# Patient Record
Sex: Female | Born: 1950 | Race: White | Hispanic: No | Marital: Married | Smoking: Current every day smoker
Health system: Southern US, Community
[De-identification: ages and names within clinical notes are randomized; demographics above are authoritative.]

## PROBLEM LIST (undated history)

## (undated) DIAGNOSIS — G43909 Migraine, unspecified, not intractable, without status migrainosus: Secondary | ICD-10-CM

## (undated) DIAGNOSIS — E785 Hyperlipidemia, unspecified: Secondary | ICD-10-CM

## (undated) DIAGNOSIS — I10 Essential (primary) hypertension: Secondary | ICD-10-CM

## (undated) DIAGNOSIS — G40909 Epilepsy, unspecified, not intractable, without status epilepticus: Secondary | ICD-10-CM

## (undated) DIAGNOSIS — I6529 Occlusion and stenosis of unspecified carotid artery: Secondary | ICD-10-CM

## (undated) HISTORY — DX: Essential (primary) hypertension: I10

## (undated) HISTORY — DX: Occlusion and stenosis of unspecified carotid artery: I65.29

## (undated) HISTORY — PX: MOUTH SURGERY: SHX715

## (undated) HISTORY — PX: TUBAL LIGATION: SHX77

## (undated) HISTORY — DX: Hyperlipidemia, unspecified: E78.5

## (undated) HISTORY — DX: Migraine, unspecified, not intractable, without status migrainosus: G43.909

## (undated) HISTORY — PX: COLONOSCOPY: SHX174

---

## 2015-05-20 ENCOUNTER — Emergency Department (HOSPITAL_COMMUNITY)
Admission: EM | Admit: 2015-05-20 | Discharge: 2015-05-20 | Disposition: A | Discharge status: Home / Self Care | Payer: Commercial Managed Care - PPO | Attending: Emergency Medicine | Admitting: Emergency Medicine

## 2015-05-20 ENCOUNTER — Emergency Department (HOSPITAL_COMMUNITY): Payer: Commercial Managed Care - PPO

## 2015-05-20 ENCOUNTER — Encounter (HOSPITAL_COMMUNITY): Payer: Self-pay | Admitting: Emergency Medicine

## 2015-05-20 DIAGNOSIS — R197 Diarrhea, unspecified: Secondary | ICD-10-CM | POA: Diagnosis not present

## 2015-05-20 DIAGNOSIS — R030 Elevated blood-pressure reading, without diagnosis of hypertension: Secondary | ICD-10-CM | POA: Insufficient documentation

## 2015-05-20 DIAGNOSIS — Z8669 Personal history of other diseases of the nervous system and sense organs: Secondary | ICD-10-CM | POA: Insufficient documentation

## 2015-05-20 DIAGNOSIS — Z720 Tobacco use: Secondary | ICD-10-CM | POA: Insufficient documentation

## 2015-05-20 DIAGNOSIS — R55 Syncope and collapse: Secondary | ICD-10-CM | POA: Diagnosis present

## 2015-05-20 DIAGNOSIS — R109 Unspecified abdominal pain: Secondary | ICD-10-CM | POA: Diagnosis not present

## 2015-05-20 DIAGNOSIS — Z79899 Other long term (current) drug therapy: Secondary | ICD-10-CM | POA: Insufficient documentation

## 2015-05-20 DIAGNOSIS — IMO0001 Reserved for inherently not codable concepts without codable children: Secondary | ICD-10-CM

## 2015-05-20 HISTORY — DX: Epilepsy, unspecified, not intractable, without status epilepticus: G40.909

## 2015-05-20 LAB — CBC
HEMATOCRIT: 43.9 % (ref 36.0–46.0)
HEMOGLOBIN: 14.3 g/dL (ref 12.0–15.0)
MCH: 28.5 pg (ref 26.0–34.0)
MCHC: 32.6 g/dL (ref 30.0–36.0)
MCV: 87.5 fL (ref 78.0–100.0)
Platelets: 211 10*3/uL (ref 150–400)
RBC: 5.02 MIL/uL (ref 3.87–5.11)
RDW: 13.7 % (ref 11.5–15.5)
WBC: 9.6 10*3/uL (ref 4.0–10.5)

## 2015-05-20 LAB — URINE MICROSCOPIC-ADD ON

## 2015-05-20 LAB — CBG MONITORING, ED: GLUCOSE-CAPILLARY: 140 mg/dL — AB (ref 65–99)

## 2015-05-20 LAB — BASIC METABOLIC PANEL
Anion gap: 7 (ref 5–15)
BUN: 6 mg/dL (ref 6–20)
CO2: 27 mmol/L (ref 22–32)
Calcium: 8.5 mg/dL — ABNORMAL LOW (ref 8.9–10.3)
Chloride: 102 mmol/L (ref 101–111)
Creatinine, Ser: 1.13 mg/dL — ABNORMAL HIGH (ref 0.44–1.00)
GFR calc non Af Amer: 51 mL/min — ABNORMAL LOW (ref >60)
GFR, EST AFRICAN AMERICAN: 59 mL/min — AB (ref >60)
Glucose, Bld: 146 mg/dL — ABNORMAL HIGH (ref 65–99)
Potassium: 3.8 mmol/L (ref 3.5–5.1)
SODIUM: 136 mmol/L (ref 135–145)

## 2015-05-20 LAB — HEPATIC FUNCTION PANEL
ALK PHOS: 56 U/L (ref 38–126)
ALT: 15 U/L (ref 14–54)
AST: 16 U/L (ref 15–41)
Albumin: 3.4 g/dL — ABNORMAL LOW (ref 3.5–5.0)
Total Bilirubin: 0.6 mg/dL (ref 0.3–1.2)
Total Protein: 6.7 g/dL (ref 6.5–8.1)

## 2015-05-20 LAB — URINALYSIS, ROUTINE W REFLEX MICROSCOPIC
Bilirubin Urine: NEGATIVE
GLUCOSE, UA: NEGATIVE mg/dL
Ketones, ur: NEGATIVE mg/dL
Leukocytes, UA: NEGATIVE
NITRITE: NEGATIVE
Protein, ur: NEGATIVE mg/dL
SPECIFIC GRAVITY, URINE: 1.006 (ref 1.005–1.030)
UROBILINOGEN UA: 1 mg/dL (ref 0.0–1.0)
pH: 6 (ref 5.0–8.0)

## 2015-05-20 LAB — TROPONIN I: Troponin I: 0.03 ng/mL (ref <0.031)

## 2015-05-20 MED ORDER — HYDROCHLOROTHIAZIDE 25 MG PO TABS: 25.0000 mg | ORAL_TABLET | Freq: Every day | ORAL | Status: DC

## 2015-05-20 MED ORDER — SODIUM CHLORIDE 0.9 % IV BOLUS (SEPSIS)
1000.0000 mL | Freq: Once | INTRAVENOUS | Status: AC
  Administered 2015-05-20: 1000 mL via INTRAVENOUS

## 2015-05-20 NOTE — Discharge Instructions (Signed)
Syncope °Syncope is a medical term for fainting or passing out. This means you lose consciousness and drop to the ground. People are generally unconscious for less than 5 minutes. You may have some muscle twitches for up to 15 seconds before waking up and returning to normal. Syncope occurs more often in older adults, but it can happen to anyone. While most causes of syncope are not dangerous, syncope can be a sign of a serious medical problem. It is important to seek medical care.  °CAUSES  °Syncope is caused by a sudden drop in blood flow to the brain. The specific cause is often not determined. Factors that can bring on syncope include: °· Taking medicines that lower blood pressure. °· Sudden changes in posture, such as standing up quickly. °· Taking more medicine than prescribed. °· Standing in one place for too long. °· Seizure disorders. °· Dehydration and excessive exposure to heat. °· Low blood sugar (hypoglycemia). °· Straining to have a bowel movement. °· Heart disease, irregular heartbeat, or other circulatory problems. °· Fear, emotional distress, seeing blood, or severe pain. °SYMPTOMS  °Right before fainting, you may: °· Feel dizzy or light-headed. °· Feel nauseous. °· See all white or all black in your field of vision. °· Have cold, clammy skin. °DIAGNOSIS  °Your health care provider will ask about your symptoms, perform a physical exam, and perform an electrocardiogram (ECG) to record the electrical activity of your heart. Your health care provider may also perform other heart or blood tests to determine the cause of your syncope which may include: °· Transthoracic echocardiogram (TTE). During echocardiography, sound waves are used to evaluate how blood flows through your heart. °· Transesophageal echocardiogram (TEE). °· Cardiac monitoring. This allows your health care provider to monitor your heart rate and rhythm in real time. °· Holter monitor. This is a portable device that records your  heartbeat and can help diagnose heart arrhythmias. It allows your health care provider to track your heart activity for several days, if needed. °· Stress tests by exercise or by giving medicine that makes the heart beat faster. °TREATMENT  °In most cases, no treatment is needed. Depending on the cause of your syncope, your health care provider may recommend changing or stopping some of your medicines. °HOME CARE INSTRUCTIONS °· Have someone stay with you until you feel stable. °· Do not drive, use machinery, or play sports until your health care provider says it is okay. °· Keep all follow-up appointments as directed by your health care provider. °· Lie down right away if you start feeling like you might faint. Breathe deeply and steadily. Wait until all the symptoms have passed. °· Drink enough fluids to keep your urine clear or pale yellow. °· If you are taking blood pressure or heart medicine, get up slowly and take several minutes to sit and then stand. This can reduce dizziness. °SEEK IMMEDIATE MEDICAL CARE IF:  °· You have a severe headache. °· You have unusual pain in the chest, abdomen, or back. °· You are bleeding from your mouth or rectum, or you have black or tarry stool. °· You have an irregular or very fast heartbeat. °· You have pain with breathing. °· You have repeated fainting or seizure-like jerking during an episode. °· You faint when sitting or lying down. °· You have confusion. °· You have trouble walking. °· You have severe weakness. °· You have vision problems. °If you fainted, call your local emergency services (911 in [place] Do not drive   yourself to the hospital.  MAKE SURE YOU:  Understand these instructions.  Will watch your condition.  Will get help right away if you are not doing well or get worse. Document Released: 10/14/2005 Document Revised: 10/19/2013 Document Reviewed: 12/13/2011 [facility] Patient Information 2015 [place], [place]. This information is not intended to replace  advice given to you by your health care provider. Make sure you discuss any questions you have with your health care provider.  Hypertension Hypertension, commonly called high blood pressure, is when the force of blood pumping through your arteries is too strong. Your arteries are the blood vessels that carry blood from your heart throughout your body. A blood pressure reading consists of a higher number over a lower number, such as 110/72. The higher number (systolic) is the pressure inside your arteries when your heart pumps. The lower number (diastolic) is the pressure inside your arteries when your heart relaxes. Ideally you want your blood pressure below 120/80. Hypertension forces your heart to work harder to pump blood. Your arteries may become narrow or stiff. Having hypertension puts you at risk for heart disease, stroke, and other problems.  RISK FACTORS Some risk factors for high blood pressure are controllable. Others are not.  Risk factors you cannot control include:   Race. You may be at higher risk if you are African American.  Age. Risk increases with age.  Gender. Men are at higher risk than women before age 57 years. After age 70, women are at higher risk than men. Risk factors you can control include:  Not getting enough exercise or physical activity.  Being overweight.  Getting too much fat, sugar, calories, or salt in your diet.  Drinking too much alcohol. SIGNS AND SYMPTOMS Hypertension does not usually cause signs or symptoms. Extremely high blood pressure (hypertensive crisis) may cause headache, anxiety, shortness of breath, and nosebleed. DIAGNOSIS  To check if you have hypertension, your health care provider will measure your blood pressure while you are seated, with your arm held at the level of your heart. It should be measured at least twice using the same arm. Certain conditions can cause a difference in blood pressure between your right and left arms. A blood  pressure reading that is higher than normal on one occasion does not mean that you need treatment. If one blood pressure reading is high, ask your health care provider about having it checked again. TREATMENT  Treating high blood pressure includes making lifestyle changes and possibly taking medicine. Living a healthy lifestyle can help lower high blood pressure. You may need to change some of your habits. Lifestyle changes may include:  Following the DASH diet. This diet is high in fruits, vegetables, and whole grains. It is low in salt, red meat, and added sugars.  Getting at least 2 hours of brisk physical activity every week.  Losing weight if necessary.  Not smoking.  Limiting alcoholic beverages.  Learning ways to reduce stress. If lifestyle changes are not enough to get your blood pressure under control, your health care provider may prescribe medicine. You may need to take more than one. Work closely with your health care provider to understand the risks and benefits. HOME CARE INSTRUCTIONS  Have your blood pressure rechecked as directed by your health care provider.   Take medicines only as directed by your health care provider. Follow the directions carefully. Blood pressure medicines must be taken as prescribed. The medicine does not work as well when you skip doses. Skipping doses  also puts you at risk for problems.   Do not smoke.   Monitor your blood pressure at home as directed by your health care provider. SEEK MEDICAL CARE IF:   You think you are having a reaction to medicines taken.  You have recurrent headaches or feel dizzy.  You have swelling in your ankles.  You have trouble with your vision. SEEK IMMEDIATE MEDICAL CARE IF:  You develop a severe headache or confusion.  You have unusual weakness, numbness, or feel faint.  You have severe chest or abdominal pain.  You vomit repeatedly.  You have trouble breathing. MAKE SURE YOU:   Understand  these instructions.  Will watch your condition.  Will get help right away if you are not doing well or get worse. Document Released: 10/14/2005 Document Revised: 02/28/2014 Document Reviewed: 08/06/2013 [facility] Patient Information 2015 [place], [place]. This information is not intended to replace advice given to you by your health care provider. Make sure you discuss any questions you have with your health care provider.   Emergency Department Resource Guide 1) Find a Doctor and Pay Out of Pocket Although you won't have to find out who is covered by your insurance plan, it is a good idea to ask around and get recommendations. You will then need to call the office and see if the doctor you have chosen will accept you as a new patient and what types of options they offer for patients who are self-pay. Some doctors offer discounts or will set up payment plans for their patients who do not have insurance, but you will need to ask so you aren't surprised when you get to your appointment.  2) Contact Your Local Health Department Not all health departments have doctors that can see patients for sick visits, but many do, so it is worth a call to see if yours does. If you don't know where your local health department is, you can check in your phone book. The CDC also has a tool to help you locate your state's health department, and many state websites also have listings of all of their local health departments.  3) Find a Walk-in Clinic If your illness is not likely to be very severe or complicated, you may want to try a walk in clinic. These are popping up all over the country in pharmacies, drugstores, and shopping centers. They're usually staffed by nurse practitioners or physician assistants that have been trained to treat common illnesses and complaints. They're usually fairly quick and inexpensive. However, if you have serious medical issues or chronic medical problems, these are probably not your best  option.  No Primary Care Doctor: - Call Health Connect at  [phone] - they can help you locate a primary care doctor that  accepts your insurance, provides certain services, etc. - Physician Referral Service- [phone]  Chronic Pain Problems: Organization         Address  Phone   Notes  [facility]  [phone] Patients need to be referred by their primary care doctor.   Medication Assistance: Organization         Address  Phone   Notes  [facility] Medication [facility] [address] [place] [place], [place] [address] [phone] --Must be a resident of [facility] -- Must have NO insurance coverage whatsoever (no Medicaid/ Medicare, etc.) -- The pt. MUST have a primary care doctor that directs their care regularly and follows them in the community  [facility]  [phone]   [employer]  [phone]    Agencies that provide inexpensive medical care: Organization         Address  Phone   Notes  [facility]  [phone]   [facility]    [phone]   [facility] [address] [place], [place] [address] [phone]   [facility] [address] [place] [place] [phone]   Planned Parenthood    [phone]   [facility]    [phone]   [facility]  [address], [place] Phone:  [phone], Fax:  [phone] Hours of Operation:  9 am - 6 pm, M-F.  Also accepts Medicaid/Medicare and self-pay.  [facility]  [address], [place] Phone: [phone], Fax: [phone]. Hours of Operation:  8:30 am - 5:30 pm, M-F.  Also accepts Medicaid and self-pay.  [facility] [place] [address], [place] Phone: [phone]   [facility] [address] Natasha Bence [place], [place] [phone], Ext. [phone] Mondays & Thursdays: 7-9 AM.  First 15  patients are seen on a first come, first serve basis.    Medicaid-accepting [facility] Providers:  Organization         Address  Phone   Notes  [facility] [address], [place] [phone] Also accepts self-pay patients.  [facility] [address] Laurell Josephs [place], [place]  [phone]   [facility] [address], [place] [phone]   [facility] [address] [place] [phone]   Renaye Rakers [address], [place]   [phone] Only accepts [place] Access [place] patients after they have their name applied to their card.   Self-Pay (no insurance) in [facility]:  Organization         Address  Phone   Notes  Sickle Cell Patients, [facility] [address] [place], [place] [phone]   [facility] [address] [place], [place] [phone]   [facility] [place]  [address], [place] [phone]   [facility]/Dr. Osei-Bonsu  [address] [place] or [address], [place] [phone] Phone number for both [place] and [place] locations is the same.  [facility] [address] [place] [address], [place] [phone]   [facility] [address] [place] or [address] [phone]   [facility] [address], [place] [phone], phone; [phone], fax Sees patients 1st and 3rd Saturday of every month.  Must not qualify for public or private insurance (i.e. Medicaid, Medicare, [place] Health Choice, Veterans' Benefits)  Household income should be no more than 200% of the poverty level The clinic cannot treat you if you are pregnant or think you are pregnant  Sexually transmitted diseases are not treated at the clinic.    Dental  Care: Organization         Address  Phone  Notes  [facility] Department of [facility] [address] [place], [place] [phone] Accepts children up to age 61 who are enrolled in [place] or [place] Health Choice;  pregnant women with a Medicaid card; and children who have applied for Medicaid or [place] Health Choice, but were declined, whose parents can pay a reduced fee at time of service.  [facility] Department of [facility]  [address], [place] [phone] Accepts children up to age 42 who are enrolled in [place] or [place] Health Choice; pregnant women with a Medicaid card; and children who have applied for Medicaid or [place] Health Choice, but were declined, whose parents can pay a reduced fee at time of service.  [facility]  [address] [place], [place] [phone] Patients are seen by appointment only. Walk-ins are not accepted. [facility] will see patients 60 years of age and older. Monday - Tuesday (8am-5pm) Most Wednesdays (8:30-5pm) $30 per visit, cash only  [facility] Adult Dental Access [facility]  [address], [facility] [phone] Patients are seen by appointment only. Walk-ins are not accepted. [facility] will see patients 49 years of age and older. One Wednesday Evening (Monthly: Volunteer Based).  $30 per visit, cash only  [employer]  [phone] for adults; Children under age 36, call Graduate Pediatric Dentistry at [phone]. Children aged 39-14, please call [phone] to request a pediatric application.  Dental services are provided in all areas of dental care including fillings, crowns and bridges, complete and partial dentures, implants, gum treatment, root canals, and extractions. Preventive care is also provided. Treatment is provided to both adults and children. Patients are selected via a lottery and there is often a waiting list.   [facility] [address] [place]  [phone] [email or web address]   [facility] [address] [place], [place] [phone], Ext. [phone] Second and Fourth Thursday of each month, opens at 6:30 AM; Clinic ends at 9 AM.  Patients are seen on a first-come first-served basis, and a limited number are seen during each clinic.   [facility]  [address] Ether Griffins [place], [place] [phone]   Eligibility Requirements You must have lived in [place], [place], or [place] counties for at least the last three months.   You cannot be eligible for state or federal sponsored [place], including [employer], [place], or [employer].   You generally cannot be eligible for healthcare insurance through your employer.    How to apply: Eligibility screenings are held every Tuesday and Wednesday afternoon from 1:00 pm until 4:00 pm. You do not need an appointment for the interview!  [facility] [address], [place], [place] [phone]   [facility] Health Department  [phone]   [facility] Health Department  [phone]   [facility] Health [facility]  [phone]    Behavioral Health Resources in the Community: Intensive Outpatient Programs Organization         Address  Phone  Notes  [facility] [address] [place], [place] [phone]   [facility] Outpatient [address] [place], [place] [phone]   [facility] [address] [place], [place]  [phone]   [facility] [address],  [place], [place] [phone] or [phone]   Substance Abuse Resources Organization         Address  Phone  Notes  [facility]  [phone]   [facility]  [phone]   [facility]  [phone]  Floydene Flock  [phone]   Residential & Outpatient Substance Abuse Program  [phone]    Psychological Services Organization         Address  Phone  Notes  [facility]  [phone]   [facility] Services  [phone]   [facility] [address] [place] [phone] or [phone]    Mobile Crisis Teams Organization         Address  Phone  Notes  [facility], Mobile Crisis Care Unit  [phone]   [facility]  [address] [place], [place] [phone]   Doristine Locks [address] [place] [phone]    Self-Help/Support Groups Organization         Address  Phone             Notes  [facility] [place] - variety of support groups  336- I7437963 Call for more information  Narcotics Anonymous (NA), [facility] [address], [employer] [place]  2 meetings at this location   Statistician         Address  Phone  Notes  [facility] [address],    [place]  [phone]   [facility]  [address] [place] [address], [place], [place] [phone]   [facility] [address] [place], [place] [phone] Admissions: 8am-3pm M-F  [facility] [address]    [place], [place] [phone]   [facility] [address] [place], [place], [place] [phone]   [facility] [address],  [place], [place] [phone]   [facility] - Intensive Outpatient [address] Laurell Josephs [address], [place], [place] [phone]   [facility] ([facility] [address] [place]  [place], [place] [phone] or [phone]   [facility] ([facility]) [address] [place], [place] [phone] Accepts Medicaid  [facility] [address],  [place] [phone] Substance Abuse/Addiction Treatment   [facility] Organization         Address  Phone  Notes  [facility]  [phone]   Angie Fava, PhD [address] Ervin Knack [place], [place]   [phone] or [phone]   [facility]   [address] [place], [place] [phone]   [facility] [address] [place], [place] [phone] Insurance/Medicaid/sponsorship through [facility] and Families [address]                                    [place], [place] [phone] Therapy/tele-psych/case  [facility] [address], [place] [phone]    Dr. Lolly Mustache  [phone]   [facility]  [employer] [facility] Dept. 1) [address], [place] 2) [address] [place] 3)  [address], [place] [phone]  [phone]   [facility] Child Abuse Hotline 3524976475 or [phone] (After Hours)

## 2015-05-20 NOTE — ED Notes (Signed)
Pt ambulated to restroom with steady gait.

## 2015-05-20 NOTE — ED Provider Notes (Signed)
CSN: [id number]     Arrival date & time 05/20/15  1107 History   First MD Initiated Contact with Patient 05/20/15 1126     Chief Complaint  Patient presents with  . Loss of Consciousness  . Abdominal Cramping     (Consider location/radiation/quality/duration/timing/severity/associated sxs/prior Treatment) HPI Patient is a 64 year old female with no past medical history who presents the ER complaining of syncopal episode. Patient states she had some mild abdominal cramping, but had an episode of diarrhea while at the grocery store. Patient states after having a bowel movement she stood up, became lightheaded, and felt hot. Patient states she began walking then subsequently experienced a syncopal episode. Patient reports waking up immediately after the episode, denies having any injury. Patient states her symptoms gradually have resolved since then, however is here for evaluation. Patient denies currently any headache, blurred vision, dizziness, weakness, chest pain, shortness of breath, nausea, vomiting, abdominal pain, persistent diarrhea, dysuria.  Past Medical History  Diagnosis Date  . Epilepsy    Past Surgical History  Procedure Laterality Date  . Mouth surgery    . Tubal ligation     History reviewed. No pertinent family history. History  Substance Use Topics  . Smoking status: Current Every Day Smoker -- 2.00 packs/day    Types: Cigarettes  . Smokeless tobacco: Not on file  . Alcohol Use: Yes     Comment: occasional   OB History    No data available     Review of Systems  Constitutional: Negative for fever.  HENT: Negative for trouble swallowing.   Eyes: Negative for visual disturbance.  Respiratory: Negative for shortness of breath.   Cardiovascular: Negative for chest pain.  Gastrointestinal: Positive for diarrhea. Negative for nausea, vomiting and abdominal pain.  Genitourinary: Negative for dysuria.  Musculoskeletal: Negative for neck pain.  Skin: Negative for  rash.  Neurological: Positive for syncope. Negative for dizziness, weakness and numbness.  Psychiatric/Behavioral: Negative.     Allergies  Dilantin  Home Medications   Prior to Admission medications   Medication Sig Start Date End Date Taking? Authorizing Provider  HYDROcodone-acetaminophen (NORCO/VICODIN) 5-325 MG per tablet Take 1 tablet by mouth daily as needed. 05/11/15  Yes Historical Provider, MD  ibuprofen (ADVIL,MOTRIN) 200 MG tablet Take 600 mg by mouth every 6 (six) hours as needed for moderate pain.   Yes Historical Provider, MD  amoxicillin (AMOXIL) 500 MG capsule Take 500 mg by mouth 3 (three) times daily. 05/11/15   Historical Provider, MD  hydrochlorothiazide (HYDRODIURIL) 25 MG tablet Take 1 tablet (25 mg total) by mouth daily. 05/20/15   Ladona Mow, PA-C   BP 219/88 mmHg  Pulse 79  Temp(Src) 97.7 F (36.5 C) (Oral)  Resp 20  Ht 5\' 4"  (1.626 m)  Wt 170 lb (77.111 kg)  BMI 29.17 kg/m2  SpO2 100% Physical Exam  Constitutional: She is oriented to person, place, and time. She appears well-developed and well-nourished. No distress.  HENT:  Head: Normocephalic and atraumatic.  Mouth/Throat: Oropharynx is clear and moist. No oropharyngeal exudate.  Eyes: Right eye exhibits no discharge. Left eye exhibits no discharge. No scleral icterus.  Neck: Normal range of motion.  Cardiovascular: Normal rate, regular rhythm and normal heart sounds.   No murmur heard. Pulmonary/Chest: Effort normal and breath sounds normal. No respiratory distress.  Abdominal: Soft. There is no tenderness.  Musculoskeletal: Normal range of motion. She exhibits no edema or tenderness.  Neurological: She is alert and oriented to person, place, and time. She  has normal strength. No cranial nerve deficit or sensory deficit. She displays a negative Romberg sign. Coordination and gait normal. GCS eye subscore is 4. GCS verbal subscore is 5. GCS motor subscore is 6.  Patient fully alert, answering questions  appropriately in full, clear sentences. Cranial nerves II through XII grossly intact. Motor strength 5 out of 5 in all major muscle groups of upper and lower extremities. Distal sensation intact.   Skin: Skin is warm and dry. No rash noted. She is not diaphoretic.  Psychiatric: She has a normal mood and affect.  Nursing note and vitals reviewed.   ED Course  Procedures (including critical care time) Labs Review Labs Reviewed  BASIC METABOLIC PANEL - Abnormal; Notable for the following:    Glucose, Bld 146 (*)    Creatinine, Ser 1.13 (*)    Calcium 8.5 (*)    GFR calc non Af Amer 51 (*)    GFR calc Af Amer 59 (*)    All other components within normal limits  URINALYSIS, ROUTINE W REFLEX MICROSCOPIC (NOT AT [facility]) - Abnormal; Notable for the following:    Hgb urine dipstick SMALL (*)    All other components within normal limits  HEPATIC FUNCTION PANEL - Abnormal; Notable for the following:    Albumin 3.4 (*)    Bilirubin, Direct <0.1 (*)    All other components within normal limits  CBG MONITORING, ED - Abnormal; Notable for the following:    Glucose-Capillary 140 (*)    All other components within normal limits  CBC  TROPONIN I  URINE MICROSCOPIC-ADD ON    Imaging Review Dg Chest 2 View  05/20/2015   CLINICAL DATA:  Recent syncopal episode, history of tobacco use  EXAM: CHEST - 2 VIEW  COMPARISON:  None.  FINDINGS: The heart size and mediastinal contours are within normal limits. Both lungs are clear. The visualized skeletal structures are unremarkable.  IMPRESSION: No active disease.   Electronically Signed   By: Alcide Clever M.D.   On: 05/20/2015 14:06     EKG Interpretation   Date/Time:  Saturday May 20 2015 11:09:26 EDT Ventricular Rate:  71 PR Interval:  162 QRS Duration: 92 QT Interval:  430 QTC Calculation: 467 R Axis:   61 Text Interpretation:  Sinus rhythm No previous tracing Confirmed by  Anitra Lauth  MD, WHITNEY ([id number]) on 05/20/2015 11:14:50 AM      MDM    Final diagnoses:  Syncope  Elevated blood pressure    Patient here for evaluation after syncopal episode. Patient asymptomatic in the ER. Patient reports abdominal cramping in her lower abdomen followed by diarrhea. She reports abdominal cramping has since resolved. Patient's abdominal exam is benign, no concern for acute abdomen. Lab work unremarkable for acute pathology. Wells Criteria 0 for PE. No concern for sepsis or SIRS. EKG without evidence of acute injury or ectopy. Troponin negative rate and 6 hours after onset of symptoms. Based on patient's history, no concern for cardiac etiology of patient's syncope. Likely vasovagal response secondary to her diarrhea. Workup here overall is benign. Neurologic exam is benign without deficit. No concern for CVA or TIA. Patient asymptomatic throughout ER stay. Patient afebrile, hemodynamically stable and in no acute distress. Patient consistently hypertensive here, however based on absence of symptoms, there is no concern for hypertensive urgency or emergency. Patient placed on HCTZ, stable for discharge. Strongly encouraged patient follow up PCP. Return precautions discussed, patient verbalizes understanding and agreement of this plan.  BP 219/88 mmHg  Pulse 79  Temp(Src) 97.7 F (36.5 C) (Oral)  Resp 20  Ht  (1.626 m)  Wt 170 lb (77.111 kg)  BMI 29.17 kg/m2  SpO2 100%  Signed,  Ladona Mow, PA-C 4:24 PM  Patient seen and discussed with Dr. Gwyneth Sprout, MD    Ladona Mow, PA-C 05/20/15 1625  Gwyneth Sprout, MD 05/20/15 [id number]

## 2015-05-20 NOTE — ED Notes (Signed)
Per MES- Pt was at [facility] this AM and had syncopal episode in bathroom after 2 episodes of diarrhea. Pt fell from standing, but denies any pain anywhere except abdominal cramping. Pt has been taking abx for recent oral surgery.

## 2015-05-25 ENCOUNTER — Telehealth: Payer: Self-pay | Admitting: Internal Medicine

## 2015-05-25 NOTE — Telephone Encounter (Signed)
Ok with me 

## 2015-05-25 NOTE — Telephone Encounter (Signed)
Patient has not been seen in 13 years. We have no record, but she states she used to be your patient. She has [employer] (in network). Would you want to accept her back as a patient?

## 2015-06-01 ENCOUNTER — Encounter: Payer: Self-pay | Admitting: Internal Medicine

## 2015-06-01 ENCOUNTER — Other Ambulatory Visit (INDEPENDENT_AMBULATORY_CARE_PROVIDER_SITE_OTHER): Payer: Commercial Managed Care - PPO

## 2015-06-01 ENCOUNTER — Ambulatory Visit (INDEPENDENT_AMBULATORY_CARE_PROVIDER_SITE_OTHER): Payer: Commercial Managed Care - PPO | Admitting: Internal Medicine

## 2015-06-01 VITALS — BP 174/100 | HR 106 | Temp 98.4°F | Ht 64.0 in | Wt 168.0 lb

## 2015-06-01 DIAGNOSIS — R002 Palpitations: Secondary | ICD-10-CM

## 2015-06-01 DIAGNOSIS — I10 Essential (primary) hypertension: Secondary | ICD-10-CM

## 2015-06-01 DIAGNOSIS — Z0000 Encounter for general adult medical examination without abnormal findings: Secondary | ICD-10-CM | POA: Diagnosis not present

## 2015-06-01 DIAGNOSIS — Z0001 Encounter for general adult medical examination with abnormal findings: Secondary | ICD-10-CM | POA: Insufficient documentation

## 2015-06-01 DIAGNOSIS — R739 Hyperglycemia, unspecified: Secondary | ICD-10-CM

## 2015-06-01 DIAGNOSIS — N189 Chronic kidney disease, unspecified: Secondary | ICD-10-CM | POA: Insufficient documentation

## 2015-06-01 DIAGNOSIS — T671XXS Heat syncope, sequela: Secondary | ICD-10-CM

## 2015-06-01 DIAGNOSIS — R7303 Prediabetes: Secondary | ICD-10-CM | POA: Insufficient documentation

## 2015-06-01 DIAGNOSIS — R55 Syncope and collapse: Secondary | ICD-10-CM | POA: Insufficient documentation

## 2015-06-01 LAB — CBC WITH DIFFERENTIAL/PLATELET
BASOS PCT: 0.5 % (ref 0.0–3.0)
Basophils Absolute: 0.1 10*3/uL (ref 0.0–0.1)
EOS ABS: 0.2 10*3/uL (ref 0.0–0.7)
Eosinophils Relative: 1.4 % (ref 0.0–5.0)
LYMPHS PCT: 25.9 % (ref 12.0–46.0)
Lymphs Abs: 4 10*3/uL (ref 0.7–4.0)
MCHC: 33.4 g/dL (ref 30.0–36.0)
MCV: 85.8 fl (ref 78.0–100.0)
Monocytes Absolute: 0.8 10*3/uL (ref 0.1–1.0)
Monocytes Relative: 5.1 % (ref 3.0–12.0)
NEUTROS PCT: 67.1 % (ref 43.0–77.0)
Neutro Abs: 10.4 10*3/uL — ABNORMAL HIGH (ref 1.4–7.7)
Platelets: 309 10*3/uL (ref 150.0–400.0)
RBC: 5.67 Mil/uL — ABNORMAL HIGH (ref 3.87–5.11)
RDW: 14.5 % (ref 11.5–15.5)

## 2015-06-01 LAB — URINALYSIS, ROUTINE W REFLEX MICROSCOPIC
Bilirubin Urine: NEGATIVE
Ketones, ur: NEGATIVE
Leukocytes, UA: NEGATIVE
Nitrite: NEGATIVE
Specific Gravity, Urine: <=1.005 — AB (ref 1.000–1.030)
Total Protein, Urine: NEGATIVE
URINE GLUCOSE: NEGATIVE
UROBILINOGEN UA: 0.2 (ref 0.0–1.0)
pH: 6.5 (ref 5.0–8.0)

## 2015-06-01 MED ORDER — AMLODIPINE BESYLATE 5 MG PO TABS: 5.0000 mg | ORAL_TABLET | Freq: Every day | ORAL | Status: DC

## 2015-06-01 NOTE — Assessment & Plan Note (Addendum)
Also for a1c, o/w asympt, for wt control, diet

## 2015-06-01 NOTE — Assessment & Plan Note (Signed)
?   Significance, for tsh, also change hct to amlod 5 qd, also 48 hr holter, echo, refer cardiology

## 2015-06-01 NOTE — Progress Notes (Signed)
Subjective:    Patient ID: Cindy Reid, female    DOB: [birth year], 64 y.o.   MRN: [id number]  HPI   Here for wellness and f/u;  Overall doing ok;  Pt denies Chest pain, worsening SOB, DOE,   Pt denies neurological change such as new headache, facial or extremity weakness.  Pt denies polydipsia, polyuria, or low sugar symptoms. Pt states overall good compliance with treatment and medications, good tolerability, and has been trying to follow appropriate diet.  Pt denies worsening depressive symptoms, suicidal ideation or panic. No fever, night sweats, wt loss, loss of appetite, or other constitutional symptoms.  Pt states good ability with ADL's, has low fall risk, home safety reviewed and adequate, no other significant changes in hearing or vision, and only occasionally active with exercise.  Seen per ED July 23 with HTn and episode syncope - ? Vasovagal with recent diarrhea  No further diarrhea, thought possible recent vasovagal episode July 23.  Since starting the HCT - Pt denies chest pain, wheezing, orthopnea, PND, increased LE swelling,, but has had intermittent palpitations/heart racing.  S/p recent syncope July 23, since then doing ok exceptet for yesterday 2 episodes 5 min each of "everything rushed to my head", arm heaviness and feeling of tightness in arms, mild sob/tightness, felt hot but also cold sweats, assoc with lightheaded and woozy, had to wait to drive later.  BP Readings from Last 3 Encounters:  06/01/15 174/100  05/20/15 219/88  Home BPs recent on hct 25 have been 119/71, 149/84, 124/71 and 131/91. Would like to change meds due to possible side effect Past Medical History  Diagnosis Date  . Epilepsy   . Hypertension   . Hyperlipidemia   . Migraine    Past Surgical History  Procedure Laterality Date  . Mouth surgery    . Tubal ligation      reports that she has been smoking Cigarettes.  She has been smoking about 2.00 packs per day. She does not have any smokeless  tobacco history on file. She reports that she drinks alcohol. She reports that she does not use illicit drugs. family history includes Cancer in her father and mother; Diabetes in her mother; Heart disease in her father. Allergies  Allergen Reactions  . Dilantin [Phenytoin] Other (See Comments)    UNSURE OF REACTION   Current Outpatient Prescriptions on File Prior to Visit  Medication Sig Dispense Refill  . HYDROcodone-acetaminophen (NORCO/VICODIN) 5-325 MG per tablet Take 1 tablet by mouth daily as needed.  0  . ibuprofen (ADVIL,MOTRIN) 200 MG tablet Take 600 mg by mouth every 6 (six) hours as needed for moderate pain.     No current facility-administered medications on file prior to visit.    Review of Systems Constitutional: Negative for increased diaphoresis, other activity, appetite or siginficant weight change other than noted HENT: Negative for worsening hearing loss, ear pain, facial swelling, mouth sores and neck stiffness.   Eyes: Negative for other worsening pain, redness or visual disturbance.  Respiratory: Negative for shortness of breath and wheezing  Cardiovascular: Negative for chest pain and palpitations.  Gastrointestinal: Negative for diarrhea, blood in stool, abdominal distention or other pain Genitourinary: Negative for hematuria, flank pain or change in urine volume.  Musculoskeletal: Negative for myalgias or other joint complaints.  Skin: Negative for color change and wound or drainage.  Neurological: Negative for syncope and numbness. other than noted Hematological: Negative for adenopathy. or other swelling Psychiatric/Behavioral: Negative for hallucinations, SI, self-injury, decreased concentration  or other worsening agitation.      Objective:   Physical Exam BP 174/100 mmHg  Pulse 106  Temp(Src) 98.4 F (36.9 C) (Oral)  Ht  (1.626 m)  Wt 168 lb (76.204 kg)  BMI 28.82 kg/m2  SpO2 96% VS noted,  Constitutional: Pt is oriented to person, place,  and time. Appears well-developed and well-nourished, in no significant distress Head: Normocephalic and atraumatic.  Right Ear: External ear normal.  Left Ear: External ear normal.  Nose: Nose normal.  Mouth/Throat: Oropharynx is clear and moist.  Eyes: Conjunctivae and EOM are normal. Pupils are equal, round, and reactive to light.  Neck: Normal range of motion. Neck supple. No JVD present. No tracheal deviation present or significant neck LA or mass Cardiovascular: Normal rate, regular rhythm, normal heart sounds and intact distal pulses.   Pulmonary/Chest: Effort normal and breath sounds without rales or wheezing  Abdominal: Soft. Bowel sounds are normal. NT. No HSM  Musculoskeletal: Normal range of motion. Exhibits no edema.  Lymphadenopathy:  Has no cervical adenopathy.  Neurological: Pt is alert and oriented to person, place, and time. Pt has normal reflexes. No cranial nerve deficit. Motor grossly intact Skin: Skin is warm and dry. No rash noted.  Psychiatric:  Has normal mood and affect. Behavior is normal.     Assessment & Plan:

## 2015-06-01 NOTE — Assessment & Plan Note (Signed)

## 2015-06-01 NOTE — Assessment & Plan Note (Signed)
Ok to change hct to amlod 5 qd,  to f/u any worsening symptoms or concerns

## 2015-06-01 NOTE — Assessment & Plan Note (Addendum)
Also refer cardiology as above, and carotid duplex

## 2015-06-01 NOTE — Progress Notes (Signed)
Pre visit review using our clinic review tool, if applicable. No additional management support is needed unless otherwise documented below in the visit note. 

## 2015-06-01 NOTE — Patient Instructions (Addendum)
Ok to stop the hct fluid pill  Please take all new medication as prescribed - the amlodipine 5 mg per day  Please continue all other medications as before, and refills have been done if requested.  Please have the pharmacy call with any other refills you may need.  Please continue your efforts at being more active, low cholesterol diet, and weight control.  You are otherwise up to date with prevention measures today.  You will be contacted regarding the referral for: cardiology, echocardiogram, holter monitor, carotid duplex testing, as well as colonoscopy  Please keep your appointments with your specialists as you may have planned  Please go to the LAB in the Basement (turn left off the elevator) for the tests to be done today  You will be contacted by phone if any changes need to be made immediately.  Otherwise, you will receive a letter about your results with an explanation, but please check with MyChart first.  Please remember to sign up for MyChart if you have not done so, as this will be important to you in the future with finding out test results, communicating by private email, and scheduling acute appointments online when needed.  Please return in 6 months, or sooner if needed

## 2015-06-02 ENCOUNTER — Other Ambulatory Visit: Payer: Self-pay | Admitting: Internal Medicine

## 2015-06-02 ENCOUNTER — Encounter: Payer: Self-pay | Admitting: Internal Medicine

## 2015-06-02 LAB — HEPATIC FUNCTION PANEL
ALBUMIN: 4.1 g/dL (ref 3.5–5.2)
ALT: 14 U/L (ref 0–35)
AST: 19 U/L (ref 0–37)
Alkaline Phosphatase: 68 U/L (ref 39–117)
BILIRUBIN DIRECT: 0.1 mg/dL (ref 0.0–0.3)
TOTAL PROTEIN: 7.7 g/dL (ref 6.0–8.3)
Total Bilirubin: 0.3 mg/dL (ref 0.2–1.2)

## 2015-06-02 LAB — BASIC METABOLIC PANEL
BUN: 15 mg/dL (ref 6–23)
CALCIUM: 9.9 mg/dL (ref 8.4–10.5)
CO2: 37 mEq/L — ABNORMAL HIGH (ref 19–32)
Chloride: 88 mEq/L — ABNORMAL LOW (ref 96–112)
Creatinine, Ser: 1.21 mg/dL — ABNORMAL HIGH (ref 0.40–1.20)
GFR: 47.65 mL/min — AB (ref >60.00)
GLUCOSE: 96 mg/dL (ref 70–99)
POTASSIUM: 2.9 meq/L — AB (ref 3.5–5.1)
Sodium: 132 mEq/L — ABNORMAL LOW (ref 135–145)

## 2015-06-02 LAB — LDL CHOLESTEROL, DIRECT: Direct LDL: 190 mg/dL

## 2015-06-02 LAB — LIPID PANEL
CHOL/HDL RATIO: 5
Cholesterol: 268 mg/dL — ABNORMAL HIGH (ref 0–200)
HDL: 50.6 mg/dL (ref >39.00)
NonHDL: 217.09
Triglycerides: 237 mg/dL — ABNORMAL HIGH (ref 0.0–149.0)
VLDL: 47.4 mg/dL — ABNORMAL HIGH (ref 0.0–40.0)

## 2015-06-02 LAB — TSH: TSH: 1.39 u[IU]/mL (ref 0.35–4.50)

## 2015-06-02 LAB — HEMOGLOBIN A1C: Hgb A1c MFr Bld: 5.8 % (ref 4.6–6.5)

## 2015-06-02 MED ORDER — POTASSIUM CHLORIDE CRYS ER 20 MEQ PO TBCR: EXTENDED_RELEASE_TABLET | ORAL | Status: DC

## 2015-06-03 ENCOUNTER — Encounter: Payer: Self-pay | Admitting: Internal Medicine

## 2015-06-03 ENCOUNTER — Other Ambulatory Visit: Payer: Self-pay | Admitting: Internal Medicine

## 2015-06-03 DIAGNOSIS — G40909 Epilepsy, unspecified, not intractable, without status epilepticus: Secondary | ICD-10-CM | POA: Insufficient documentation

## 2015-06-03 DIAGNOSIS — D72829 Elevated white blood cell count, unspecified: Secondary | ICD-10-CM

## 2015-06-03 DIAGNOSIS — E871 Hypo-osmolality and hyponatremia: Secondary | ICD-10-CM

## 2015-06-03 DIAGNOSIS — G43909 Migraine, unspecified, not intractable, without status migrainosus: Secondary | ICD-10-CM | POA: Insufficient documentation

## 2015-06-03 DIAGNOSIS — E785 Hyperlipidemia, unspecified: Secondary | ICD-10-CM | POA: Insufficient documentation

## 2015-06-03 MED ORDER — ATORVASTATIN CALCIUM 20 MG PO TABS: 20.0000 mg | ORAL_TABLET | Freq: Every day | ORAL | Status: DC

## 2015-06-09 ENCOUNTER — Other Ambulatory Visit (INDEPENDENT_AMBULATORY_CARE_PROVIDER_SITE_OTHER): Payer: Commercial Managed Care - PPO

## 2015-06-09 DIAGNOSIS — D72829 Elevated white blood cell count, unspecified: Secondary | ICD-10-CM

## 2015-06-09 DIAGNOSIS — E871 Hypo-osmolality and hyponatremia: Secondary | ICD-10-CM

## 2015-06-09 LAB — BASIC METABOLIC PANEL WITH GFR
BUN: 8 mg/dL (ref 6–23)
CO2: 30 meq/L (ref 19–32)
Calcium: 9.3 mg/dL (ref 8.4–10.5)
Chloride: 100 meq/L (ref 96–112)
Creatinine, Ser: 1.03 mg/dL (ref 0.40–1.20)
GFR: 57.38 mL/min — ABNORMAL LOW
Glucose, Bld: 119 mg/dL — ABNORMAL HIGH (ref 70–99)
Potassium: 3.1 meq/L — ABNORMAL LOW (ref 3.5–5.1)
Sodium: 136 meq/L (ref 135–145)

## 2015-06-09 LAB — CBC WITH DIFFERENTIAL/PLATELET
Basophils Absolute: 0.1 K/uL (ref 0.0–0.1)
Basophils Relative: 0.6 % (ref 0.0–3.0)
Eosinophils Absolute: 0.3 K/uL (ref 0.0–0.7)
Eosinophils Relative: 3.1 % (ref 0.0–5.0)
HCT: 41.8 % (ref 36.0–46.0)
Hemoglobin: 14 g/dL (ref 12.0–15.0)
Lymphocytes Relative: 32.4 % (ref 12.0–46.0)
Lymphs Abs: 3 K/uL (ref 0.7–4.0)
MCHC: 33.4 g/dL (ref 30.0–36.0)
MCV: 85.3 fl (ref 78.0–100.0)
Monocytes Absolute: 0.5 K/uL (ref 0.1–1.0)
Monocytes Relative: 5.2 % (ref 3.0–12.0)
Neutro Abs: 5.4 K/uL (ref 1.4–7.7)
Neutrophils Relative %: 58.7 % (ref 43.0–77.0)
Platelets: 272 K/uL (ref 150.0–400.0)
RBC: 4.91 Mil/uL (ref 3.87–5.11)
RDW: 14.3 % (ref 11.5–15.5)
WBC: 9.2 K/uL (ref 4.0–10.5)

## 2015-06-10 ENCOUNTER — Other Ambulatory Visit: Payer: Self-pay | Admitting: Internal Medicine

## 2015-06-10 MED ORDER — POTASSIUM CHLORIDE CRYS ER 20 MEQ PO TBCR: EXTENDED_RELEASE_TABLET | ORAL | Status: DC

## 2015-06-13 ENCOUNTER — Other Ambulatory Visit: Payer: Self-pay | Admitting: Internal Medicine

## 2015-06-13 DIAGNOSIS — T671XXD Heat syncope, subsequent encounter: Secondary | ICD-10-CM

## 2015-06-16 ENCOUNTER — Ambulatory Visit (INDEPENDENT_AMBULATORY_CARE_PROVIDER_SITE_OTHER): Payer: Commercial Managed Care - PPO

## 2015-06-16 DIAGNOSIS — T671XXS Heat syncope, sequela: Secondary | ICD-10-CM

## 2015-06-16 DIAGNOSIS — I10 Essential (primary) hypertension: Secondary | ICD-10-CM | POA: Diagnosis not present

## 2015-06-16 DIAGNOSIS — R002 Palpitations: Secondary | ICD-10-CM

## 2015-06-19 ENCOUNTER — Other Ambulatory Visit: Payer: Self-pay | Admitting: Internal Medicine

## 2015-06-19 DIAGNOSIS — R42 Dizziness and giddiness: Secondary | ICD-10-CM

## 2015-06-19 DIAGNOSIS — T671XXD Heat syncope, subsequent encounter: Secondary | ICD-10-CM

## 2015-06-20 ENCOUNTER — Other Ambulatory Visit: Payer: Self-pay

## 2015-06-20 ENCOUNTER — Ambulatory Visit (HOSPITAL_COMMUNITY): Payer: Commercial Managed Care - PPO | Attending: Cardiovascular Disease

## 2015-06-20 DIAGNOSIS — F17200 Nicotine dependence, unspecified, uncomplicated: Secondary | ICD-10-CM | POA: Diagnosis not present

## 2015-06-20 DIAGNOSIS — R55 Syncope and collapse: Secondary | ICD-10-CM | POA: Diagnosis not present

## 2015-06-20 DIAGNOSIS — I10 Essential (primary) hypertension: Secondary | ICD-10-CM | POA: Diagnosis not present

## 2015-06-20 DIAGNOSIS — Z8249 Family history of ischemic heart disease and other diseases of the circulatory system: Secondary | ICD-10-CM | POA: Diagnosis not present

## 2015-06-20 DIAGNOSIS — I517 Cardiomegaly: Secondary | ICD-10-CM | POA: Insufficient documentation

## 2015-06-20 DIAGNOSIS — T671XXS Heat syncope, sequela: Secondary | ICD-10-CM | POA: Diagnosis not present

## 2015-06-20 DIAGNOSIS — I351 Nonrheumatic aortic (valve) insufficiency: Secondary | ICD-10-CM | POA: Insufficient documentation

## 2015-06-20 DIAGNOSIS — E785 Hyperlipidemia, unspecified: Secondary | ICD-10-CM | POA: Insufficient documentation

## 2015-06-21 ENCOUNTER — Encounter: Payer: Self-pay | Admitting: Internal Medicine

## 2015-06-22 ENCOUNTER — Ambulatory Visit (HOSPITAL_COMMUNITY)
Admission: RE | Admit: 2015-06-22 | Discharge: 2015-06-22 | Disposition: A | Discharge status: Home / Self Care | Payer: Commercial Managed Care - PPO | Source: Ambulatory Visit | Attending: Cardiology | Admitting: Cardiology

## 2015-06-22 DIAGNOSIS — R42 Dizziness and giddiness: Secondary | ICD-10-CM | POA: Diagnosis not present

## 2015-06-22 DIAGNOSIS — T671XXD Heat syncope, subsequent encounter: Secondary | ICD-10-CM | POA: Insufficient documentation

## 2015-06-22 DIAGNOSIS — X30XXXD Exposure to excessive natural heat, subsequent encounter: Secondary | ICD-10-CM | POA: Diagnosis not present

## 2015-06-22 DIAGNOSIS — R55 Syncope and collapse: Secondary | ICD-10-CM

## 2015-06-22 DIAGNOSIS — I6523 Occlusion and stenosis of bilateral carotid arteries: Secondary | ICD-10-CM | POA: Diagnosis not present

## 2015-06-27 ENCOUNTER — Ambulatory Visit (INDEPENDENT_AMBULATORY_CARE_PROVIDER_SITE_OTHER): Payer: Commercial Managed Care - PPO | Admitting: Cardiovascular Disease

## 2015-06-27 ENCOUNTER — Encounter: Payer: Self-pay | Admitting: Cardiovascular Disease

## 2015-06-27 VITALS — BP 174/98 | HR 104 | Ht 64.0 in | Wt 172.3 lb

## 2015-06-27 DIAGNOSIS — R55 Syncope and collapse: Secondary | ICD-10-CM

## 2015-06-27 MED ORDER — AMLODIPINE BESYLATE 10 MG PO TABS: 10.0000 mg | ORAL_TABLET | Freq: Every day | ORAL | Status: DC

## 2015-06-27 NOTE — Progress Notes (Signed)
Cardiology Office Note   Date:  06/27/2015   ID:  Cindy Reid, DOB [birth year], MRN [id number]  PCP:  Oliver Barre, MD  Cardiologist:   Madilyn Hook, MD   Chief Complaint  Patient presents with  . Hospitalization Follow-up    patient's husband reports that she passed out 05/20/15, and patient reports some palpitations-she experienced them back to back one afternoon and none since.       History of Present Illness: Cindy Reid is a 64 y.o. female with hypertension, hyperlipidemia and palpitations who presents for an evaluation of syncope.  On 05/20/15 Cindy Reid was evaluated in the ED for a syncopal episode.  She was in the bathroom at [facility] with an upset stomach and diarrhea.  After using the bathroom and washing her hands, she was on her way out and the next thing she remembers she was on the floor.  There was no preceding chest pain, shortness of breath, palpitations, or warning. She has never had an episode like this in the past. She is brought to the ED for evaluation and was thought to have had an orthostatic hypotension episode.  Cindy Reid followed up with her primary care doctor who ordered an echocardiogram that was unremarkable aside from grade 1 diastolic dysfunction. A 48 hour Holter revealed sinus rhythm with PACs and PVCs. Less than 1% ectopic beats. She also had rare, short-lived, atrial runs that lasted for less than 2 seconds each.  She had a carotid ultrasound that showed mild bilateral ICA stenosis but no signficant obstruction.. Cindy Reid has not had any further episodes.  She walks her dogs several times daily without chest pain, shortness of breath, or presyncope. She denies any lower extremity edema, orthopnea, or PND.  Cindy Reid continues to smoke 2 packs of cigarettes daily. She has smoked for over 40 years. She is not interested in trying to quit this time. She was able to successfully quit twice in the past. This time she quit cold [place]. She  did try using a nicotine patch once but it fell off and she has not tried again. Her husband quit smoking and she does not smoke around her grandchildren.  Past Medical History  Diagnosis Date  . Epilepsy   . Hypertension   . Hyperlipidemia   . Migraine     Past Surgical History  Procedure Laterality Date  . Mouth surgery    . Tubal ligation       Current Outpatient Prescriptions  Medication Sig Dispense Refill  . amLODipine (NORVASC) 10 MG tablet Take 1 tablet (10 mg total) by mouth daily. 90 tablet 3  . atorvastatin (LIPITOR) 20 MG tablet Take 1 tablet (20 mg total) by mouth daily. 90 tablet 3  . chlorhexidine (PERIDEX) 0.12 % solution swish with 1/2 ounces for 30 SECONDS then SPIT OUT twice a day  0  . HYDROcodone-acetaminophen (NORCO/VICODIN) 5-325 MG per tablet Take 1 tablet by mouth daily as needed.  0  . ibuprofen (ADVIL,MOTRIN) 200 MG tablet Take 600 mg by mouth every 6 (six) hours as needed for moderate pain.    . potassium chloride SA (K-DUR,KLOR-CON) 20 MEQ tablet 1 tab by mouth per day for 14 days 14 tablet 0   No current facility-administered medications for this visit.    Allergies:   Dilantin    Social History:  The patient  reports that she has been smoking Cigarettes.  She has been smoking about 2.00 packs per day. She does  not have any smokeless tobacco history on file. She reports that she drinks alcohol. She reports that she does not use illicit drugs.   Family History:  [name] family history includes Cancer in her father and mother; Diabetes in her mother; Heart disease in her father.    ROS:  Please see the history of present illness.  Otherwise, review of systems are positive for none.   All other systems are reviewed and negative.    PHYSICAL EXAM: VS:  BP 174/98 mmHg  Pulse 104  Ht  (1.626 m)  Wt 78.155 kg (172 lb 4.8 oz)  BMI 29.56 kg/m2 , BMI Body mass index is 29.56 kg/(m^2). GENERAL:  Well appearing HEENT:  Pupils equal round and  reactive, fundi not visualized, oral mucosa unremarkable NECK:  No jugular venous distention, waveform within normal limits, carotid upstroke brisk and symmetric, mild R carotid bruits, no thyromegaly LYMPHATICS:  No cervical adenopathy LUNGS:  Clear to auscultation bilaterally HEART:  RRR.  PMI not displaced or sustained,S1 and S2 within normal limits, no S3, no S4, no clicks, no rubs, no murmurs ABD:  Flat, positive bowel sounds normal in frequency in pitch, no bruits, no rebound, no guarding, no midline pulsatile mass, no hepatomegaly, no splenomegaly EXT:  2 plus pulses throughout, no edema, no cyanosis no clubbing SKIN:  No rashes no nodules NEURO:  Cranial nerves II through XII grossly intact, motor grossly intact throughout PSYCH:  Cognitively intact, oriented to person place and time    EKG:  EKG is ordered today. The ekg ordered today demonstrates sinus tachycardia at 104 bpm.    Carotid ultrasound 06/22/15: 1-39% bilateral ICA stenosis  TTE 06/20/15: LVEF 55-60%.  Moderate LVH.  Trivial AR.     Recent Labs: 06/01/2015: ALT 14; TSH 1.39 06/09/2015: BUN 8; Creatinine, Ser 1.03; Hemoglobin 14.0; Platelets 272.0; Potassium 3.1*; Sodium 136    Lipid Panel    Component Value Date/Time   CHOL 268* 06/01/2015 1725   TRIG 237.0* 06/01/2015 1725   HDL 50.60 06/01/2015 1725   CHOLHDL 5 06/01/2015 1725   VLDL 47.4* 06/01/2015 1725   LDLDIRECT 190.0 06/01/2015 1725      Wt Readings from Last 3 Encounters:  06/27/15 78.155 kg (172 lb 4.8 oz)  06/01/15 76.204 kg (168 lb)  05/20/15 77.111 kg (170 lb)      Other studies Reviewed: Additional studies/ records that were reviewed today include: medical record . Review of the above records demonstrates:  Please see elsewhere in the note.     ASSESSMENT AND PLAN:  # Syncope: Episode sounds mostly vasovagal in the setting of upset stomach and diarrhea.  Orthostasis may have also contributed.  She has no recurrent symptoms and cardiac  work up has been unremarkable.  No further work up is needed at this time unless she has recurrent symptoms.  # HTN: Ms. Varghese was first discovered to have hypertension at the time of the syncopal episode.  BP still above goal.  Will increase amlodipine to 10 mg. She is likely to need an additional agent to get her blood pressure to goal.  # Hyperlipidemia: Managed by Dr. Jonny Ruiz.  Agree with statin therapy.  Given that her 10 year risk of ASCVD is 24.9%, would consider switching to high-intensity (Atorvastatin ).  # Tobacco abuse: Ms. Weisel is not interested in smoking cessation at this time. She will continue to think about this and we will rediscuss it at her next appointment.  # Carotid bruit: No  obstructive carotid disease on carotid ultrasound.   Current medicines are reviewed at length with the patient today.  The patient does not have concerns regarding medicines.  The following changes have been made:  Increase amlodipine to 10mg  daily.  Labs/ tests ordered today include:   Orders Placed This Encounter  Procedures  . EKG 12-Lead     Disposition:   FU with Dr. Elmarie Shiley C. Duke Salvia in 1 year.   Signed, Madilyn Hook, MD  06/27/2015 4:20 PM     [facility]

## 2015-06-27 NOTE — Patient Instructions (Signed)
Dr Duke Salvia has recommended making the following medication changes: INCREASE Amlodipine to 10 mg. You make take 2 tablets of your current prescription until that bottle runs out. A new prescription has been sent to your pharmacy electronically. Please follow the new instructions on the new bottle when you pick it up.  Dr Duke Salvia recommends that you schedule a follow-up appointment in 1 year. You will receive a reminder letter in the mail two months in advance. If you don't receive a letter, please call our office to schedule the follow-up appointment.

## 2015-07-19 ENCOUNTER — Telehealth: Payer: Self-pay

## 2015-07-19 NOTE — Telephone Encounter (Signed)
Take 4 dulcolax and 2 doses MiraLax extra day before the prep in afternoon or early eveneing No diet changes for procedure

## 2015-07-19 NOTE — Telephone Encounter (Signed)
Last prep with Golytely "fair, adequate".  Proceed with Miralax or extended prep?  Thank you, Angela/PV

## 2015-07-20 ENCOUNTER — Ambulatory Visit (AMBULATORY_SURGERY_CENTER): Payer: Self-pay

## 2015-07-20 VITALS — Ht 62.75 in | Wt 172.8 lb

## 2015-07-20 DIAGNOSIS — Z860100 Personal history of colon polyps, unspecified: Secondary | ICD-10-CM

## 2015-07-20 NOTE — Progress Notes (Signed)
No allergies to eggs or soy No diet/weight loss meds No past problems with anesthesia No home oxygen  Has email  Emmi instructions given for colonoscopy 

## 2015-08-03 ENCOUNTER — Encounter: Payer: Self-pay | Admitting: Internal Medicine

## 2015-08-03 ENCOUNTER — Ambulatory Visit (AMBULATORY_SURGERY_CENTER): Payer: Commercial Managed Care - PPO | Admitting: Internal Medicine

## 2015-08-03 VITALS — BP 152/86 | HR 94 | Temp 98.1°F | Resp 15 | Ht 62.0 in | Wt 175.0 lb

## 2015-08-03 DIAGNOSIS — Z1211 Encounter for screening for malignant neoplasm of colon: Secondary | ICD-10-CM

## 2015-08-03 MED ORDER — SODIUM CHLORIDE 0.9 % IV SOLN: 500.0000 mL | INTRAVENOUS | Status: DC

## 2015-08-03 NOTE — Patient Instructions (Addendum)
No polyps! You do have a condition called diverticulosis - common and not usually a problem. Please read the handout provided.  I appreciate the opportunity to care for you. Iva Boop, MD, [facility]  Discharge instructions given. Handout on diverticulosis. Resume previous medications. YOU HAD AN ENDOSCOPIC PROCEDURE TODAY AT THE [facility]:   Refer to the procedure report that was given to you for any specific questions about what was found during the examination.  If the procedure report does not answer your questions, please call your gastroenterologist to clarify.  If you requested that your care partner not be given the details of your procedure findings, then the procedure report has been included in a sealed envelope for you to review at your convenience later.  YOU SHOULD EXPECT: Some feelings of bloating in the abdomen. Passage of more gas than usual.  Walking can help get rid of the air that was put into your GI tract during the procedure and reduce the bloating. If you had a lower endoscopy (such as a colonoscopy or flexible sigmoidoscopy) you may notice spotting of blood in your stool or on the toilet paper. If you underwent a bowel prep for your procedure, you may not have a normal bowel movement for a few days.  Please Note:  You might notice some irritation and congestion in your nose or some drainage.  This is from the oxygen used during your procedure.  There is no need for concern and it should clear up in a day or so.  SYMPTOMS TO REPORT IMMEDIATELY:   Following lower endoscopy (colonoscopy or flexible sigmoidoscopy):  Excessive amounts of blood in the stool  Significant tenderness or worsening of abdominal pains  Swelling of the abdomen that is new, acute  Fever of 100F or higher   For urgent or emergent issues, a gastroenterologist can be reached at any hour by calling [phone].   DIET: Your first meal following the procedure should be a  small meal and then it is ok to progress to your normal diet. Heavy or fried foods are harder to digest and may make you feel nauseous or bloated.  Likewise, meals heavy in dairy and vegetables can increase bloating.  Drink plenty of fluids but you should avoid alcoholic beverages for 24 hours.  ACTIVITY:  You should plan to take it easy for the rest of today and you should NOT DRIVE or use heavy machinery until tomorrow (because of the sedation medicines used during the test).    FOLLOW UP: Our staff will call the number listed on your records the next business day following your procedure to check on you and address any questions or concerns that you may have regarding the information given to you following your procedure. If we do not reach you, we will leave a message.  However, if you are feeling well and you are not experiencing any problems, there is no need to return our call.  We will assume that you have returned to your regular daily activities without incident.  If any biopsies were taken you will be contacted by phone or by letter within the next 1-3 weeks.  Please call us at [phone] if you have not heard about the biopsies in 3 weeks.    SIGNATURES/CONFIDENTIALITY: You and/or your care partner have signed paperwork which will be entered into your electronic medical record.  These signatures attest to the fact that that the information above on your After Visit Summary has  been reviewed and is understood.  Full responsibility of the confidentiality of this discharge information lies with you and/or your care-partner.

## 2015-08-03 NOTE — Op Note (Signed)
[facility] [address] [place], [address]   COLONOSCOPY PROCEDURE REPORT  PATIENT: Cindy Reid, Cindy Reid  MR#: [id number] BIRTHDATE: 1951-02-21 , 63  yrs. old GENDER: female ENDOSCOPIST: Iva Boop, MD, [facility] PROCEDURE DATE:  08/03/2015 PROCEDURE:   Colonoscopy, screening First Screening Colonoscopy - Avg.  risk and is 50 yrs.  old or older - No.  Prior Negative Screening - Now for repeat screening. 10 or more years since last screening  History of Adenoma - Now for follow-up colonoscopy & has been > or = to 3 yrs.  N/A  Polyps removed today? No Recommend repeat exam, <10 yrs? No ASA CLASS:   Class II INDICATIONS:Screening for colonic neoplasia and Colorectal Neoplasm Risk Assessment for this procedure is average risk. MEDICATIONS: Propofol 200 mg IV and Monitored anesthesia care  DESCRIPTION OF PROCEDURE:   After the risks benefits and alternatives of the procedure were thoroughly explained, informed consent was obtained.  The digital rectal exam revealed no rectal mass and revealed several skin tags.   The [id number] endoscope was introduced through the anus and advanced to the cecum, which was identified by both the appendix and ileocecal valve. No adverse events experienced.   The quality of the prep was good.  (MiraLax was used)  The instrument was then slowly withdrawn as the colon was fully examined. Estimated blood loss is zero unless otherwise noted in this procedure report.      COLON FINDINGS: There was moderate diverticulosis noted in the left colon.   The examination was otherwise normal.  Retroflexed views revealed no abnormalities. The time to cecum = 2.3 Withdrawal time = 10.3   The scope was withdrawn and the procedure completed. COMPLICATIONS: There were no immediate complications.  ENDOSCOPIC IMPRESSION: 1.   Moderate diverticulosis was noted in the left colon 2.   The examination was otherwise normal - good  prep  RECOMMENDATIONS: Repeat colonoscopy/screening test 10 years.  2026  eSigned:  Iva Boop, MD, [facility] 08/03/2015 2:00 PM   cc: The Patient

## 2015-08-03 NOTE — Progress Notes (Signed)
Report to PACU, RN, vss, BBS= Clear.  

## 2015-08-04 ENCOUNTER — Telehealth: Payer: Self-pay | Admitting: Emergency Medicine

## 2015-08-04 NOTE — Telephone Encounter (Signed)
  Follow up Call-  Call back number 08/03/2015  Post procedure Call Back phone  # [phone]  Permission to leave phone message Yes     Patient questions:  Do you have a fever, pain , or abdominal swelling? No. Pain Score  0 *  Have you tolerated food without any problems? Yes.    Have you been able to return to your normal activities? Yes.    Do you have any questions about your discharge instructions: Diet   No. Medications  No. Follow up visit  No.  Do you have questions or concerns about your Care? No.  Actions: * If pain score is 4 or above: No action needed, pain <4.

## 2015-09-06 ENCOUNTER — Other Ambulatory Visit: Payer: Self-pay

## 2015-09-06 ENCOUNTER — Telehealth: Payer: Self-pay | Admitting: *Deleted

## 2015-09-06 MED ORDER — AMLODIPINE BESYLATE 10 MG PO TABS: 10.0000 mg | ORAL_TABLET | Freq: Every day | ORAL | Status: DC

## 2015-09-06 MED ORDER — ATORVASTATIN CALCIUM 20 MG PO TABS: 20.0000 mg | ORAL_TABLET | Freq: Every day | ORAL | Status: DC

## 2015-09-06 NOTE — Telephone Encounter (Signed)
NEED REFILL FOR AMLODIPINE 10 MG TO EXPRESSSCRIPT  E-SENT  TO MAIL ORDER-#90 X 3 PATIENT AWARE

## 2015-11-30 ENCOUNTER — Encounter: Payer: Self-pay | Admitting: Internal Medicine

## 2015-11-30 DIAGNOSIS — Z0000 Encounter for general adult medical examination without abnormal findings: Secondary | ICD-10-CM

## 2015-12-01 ENCOUNTER — Other Ambulatory Visit (INDEPENDENT_AMBULATORY_CARE_PROVIDER_SITE_OTHER): Payer: Commercial Managed Care - PPO

## 2015-12-01 DIAGNOSIS — Z0000 Encounter for general adult medical examination without abnormal findings: Secondary | ICD-10-CM | POA: Diagnosis not present

## 2015-12-01 LAB — CBC WITH DIFFERENTIAL/PLATELET
BASOS PCT: 0.6 % (ref 0.0–3.0)
Basophils Absolute: 0.1 10*3/uL (ref 0.0–0.1)
Eosinophils Absolute: 0.2 10*3/uL (ref 0.0–0.7)
Eosinophils Relative: 2.8 % (ref 0.0–5.0)
HCT: 45.5 % (ref 36.0–46.0)
Hemoglobin: 15.1 g/dL — ABNORMAL HIGH (ref 12.0–15.0)
LYMPHS PCT: 33.8 % (ref 12.0–46.0)
Lymphs Abs: 2.9 10*3/uL (ref 0.7–4.0)
MCHC: 33.1 g/dL (ref 30.0–36.0)
MCV: 86.7 fl (ref 78.0–100.0)
MONOS PCT: 6.3 % (ref 3.0–12.0)
Monocytes Absolute: 0.5 10*3/uL (ref 0.1–1.0)
NEUTROS ABS: 4.9 10*3/uL (ref 1.4–7.7)
Neutrophils Relative %: 56.5 % (ref 43.0–77.0)
PLATELETS: 249 10*3/uL (ref 150.0–400.0)
RBC: 5.24 Mil/uL — ABNORMAL HIGH (ref 3.87–5.11)
RDW: 14.5 % (ref 11.5–15.5)
WBC: 8.6 10*3/uL (ref 4.0–10.5)

## 2015-12-01 LAB — BASIC METABOLIC PANEL
BUN: 11 mg/dL (ref 6–23)
CALCIUM: 9.2 mg/dL (ref 8.4–10.5)
CHLORIDE: 104 meq/L (ref 96–112)
CO2: 27 mEq/L (ref 19–32)
CREATININE: 1 mg/dL (ref 0.40–1.20)
GFR: 59.28 mL/min — AB (ref >60.00)
Glucose, Bld: 110 mg/dL — ABNORMAL HIGH (ref 70–99)
Potassium: 3.7 mEq/L (ref 3.5–5.1)
Sodium: 139 mEq/L (ref 135–145)

## 2015-12-01 LAB — HEPATIC FUNCTION PANEL
ALT: 10 U/L (ref 0–35)
AST: 12 U/L (ref 0–37)
Albumin: 4 g/dL (ref 3.5–5.2)
Alkaline Phosphatase: 71 U/L (ref 39–117)
BILIRUBIN DIRECT: 0.1 mg/dL (ref 0.0–0.3)
BILIRUBIN TOTAL: 0.4 mg/dL (ref 0.2–1.2)
Total Protein: 7.6 g/dL (ref 6.0–8.3)

## 2015-12-01 LAB — URINALYSIS, ROUTINE W REFLEX MICROSCOPIC
BILIRUBIN URINE: NEGATIVE
KETONES UR: NEGATIVE
LEUKOCYTES UA: NEGATIVE
Nitrite: NEGATIVE
PH: 5.5 (ref 5.0–8.0)
TOTAL PROTEIN, URINE-UPE24: NEGATIVE
UROBILINOGEN UA: 0.2 (ref 0.0–1.0)
Urine Glucose: NEGATIVE
WBC, UA: NONE SEEN (ref 0–?)

## 2015-12-01 LAB — TSH: TSH: 2.07 u[IU]/mL (ref 0.35–4.50)

## 2015-12-01 LAB — LIPID PANEL
CHOL/HDL RATIO: 4
Cholesterol: 184 mg/dL (ref 0–200)
HDL: 45.3 mg/dL (ref >39.00)
LDL Cholesterol: 107 mg/dL — ABNORMAL HIGH (ref 0–99)
NONHDL: 138.68
Triglycerides: 159 mg/dL — ABNORMAL HIGH (ref 0.0–149.0)
VLDL: 31.8 mg/dL (ref 0.0–40.0)

## 2015-12-07 ENCOUNTER — Ambulatory Visit (INDEPENDENT_AMBULATORY_CARE_PROVIDER_SITE_OTHER): Payer: Commercial Managed Care - PPO | Admitting: Internal Medicine

## 2015-12-07 ENCOUNTER — Encounter: Payer: Self-pay | Admitting: Internal Medicine

## 2015-12-07 VITALS — BP 142/88 | HR 94 | Temp 97.9°F | Resp 20 | Ht 62.0 in | Wt 172.0 lb

## 2015-12-07 DIAGNOSIS — Z0000 Encounter for general adult medical examination without abnormal findings: Secondary | ICD-10-CM | POA: Diagnosis not present

## 2015-12-07 DIAGNOSIS — Z23 Encounter for immunization: Secondary | ICD-10-CM | POA: Diagnosis not present

## 2015-12-07 MED ORDER — ASPIRIN EC 81 MG PO TBEC: 81.0000 mg | DELAYED_RELEASE_TABLET | Freq: Every day | ORAL | Status: AC

## 2015-12-07 NOTE — Addendum Note (Signed)
Addended by: Etheleen Mayhew C on: 12/07/2015 02:32 PM   Modules accepted: Orders

## 2015-12-07 NOTE — Progress Notes (Signed)
Pre visit review using our clinic review tool, if applicable. No additional management support is needed unless otherwise documented below in the visit note. 

## 2015-12-07 NOTE — Progress Notes (Signed)
Subjective:    Patient ID: Cindy Reid, female    DOB: [birth year], 65 y.o.   MRN: [id number]  HPI  Here for wellness and f/u;  Overall doing ok;  Pt denies Chest pain, worsening SOB, DOE, wheezing, orthopnea, PND, worsening LE edema, palpitations, dizziness or syncope.  Pt denies neurological change such as new headache, facial or extremity weakness.  Pt denies polydipsia, polyuria, or low sugar symptoms. Pt states overall good compliance with treatment and medications, good tolerability, and has been trying to follow appropriate diet.  Pt denies worsening depressive symptoms, suicidal ideation or panic. No fever, night sweats, wt loss, loss of appetite, or other constitutional symptoms.  Pt states good ability with ADL's, has low fall risk, home safety reviewed and adequate, no other significant changes in hearing or vision, and only occasionally active with exercise. Due for Tdap and flu.  Did have syncopal episode post oral surgury, no further symptoms   Past Medical History  Diagnosis Date  . Epilepsy (HCC)   . Hypertension   . Hyperlipidemia   . Migraine    Past Surgical History  Procedure Laterality Date  . Mouth surgery    . Tubal ligation    . Colonoscopy      reports that she has been smoking Cigarettes.  She has been smoking about 2.00 packs per day. She does not have any smokeless tobacco history on file. She reports that she drinks alcohol. She reports that she does not use illicit drugs. family history includes Cancer in her father and mother; Colon cancer in her maternal aunt; Diabetes in her mother; Heart disease in her father. Allergies  Allergen Reactions  . Dilantin [Phenytoin] Other (See Comments)    UNSURE OF REACTION   Current Outpatient Prescriptions on File Prior to Visit  Medication Sig Dispense Refill  . amLODipine (NORVASC) 10 MG tablet Take 1 tablet (10 mg total) by mouth daily. 90 tablet 3  . atorvastatin (LIPITOR) 20 MG tablet Take 1 tablet (20 mg  total) by mouth daily. 90 tablet 3   No current facility-administered medications on file prior to visit.   Review of Systems Constitutional: Negative for increased diaphoresis, other activity, appetite or siginficant weight change other than noted HENT: Negative for worsening hearing loss, ear pain, facial swelling, mouth sores and neck stiffness.   Eyes: Negative for other worsening pain, redness or visual disturbance.  Respiratory: Negative for shortness of breath and wheezing  Cardiovascular: Negative for chest pain and palpitations.  Gastrointestinal: Negative for diarrhea, blood in stool, abdominal distention or other pain Genitourinary: Negative for hematuria, flank pain or change in urine volume.  Musculoskeletal: Negative for myalgias or other joint complaints.  Skin: Negative for color change and wound or drainage.  Neurological: Negative for syncope and numbness. other than noted Hematological: Negative for adenopathy. or other swelling Psychiatric/Behavioral: Negative for hallucinations, SI, self-injury, decreased concentration or other worsening agitation.      Objective:   Physical Exam BP 142/88 mmHg  Pulse 94  Temp(Src) 97.9 F (36.6 C) (Oral)  Resp 20  Ht  (1.575 m)  Wt 172 lb (78.019 kg)  BMI 31.45 kg/m2  SpO2 97% VS noted,  Constitutional: Pt is oriented to person, place, and time. Appears well-developed and well-nourished, in no significant distress Head: Normocephalic and atraumatic.  Right Ear: External ear normal.  Left Ear: External ear normal.  Nose: Nose normal.  Mouth/Throat: Oropharynx is clear and moist.  Eyes: Conjunctivae and EOM are normal. Pupils  are equal, round, and reactive to light.  Neck: Normal range of motion. Neck supple. No JVD present. No tracheal deviation present or significant neck LA or mass Cardiovascular: Normal rate, regular rhythm, normal heart sounds and intact distal pulses.   Pulmonary/Chest: Effort normal and breath  sounds without rales or wheezing  Abdominal: Soft. Bowel sounds are normal. NT. No HSM  Musculoskeletal: Normal range of motion. Exhibits no edema.  Lymphadenopathy:  Has no cervical adenopathy.  Neurological: Pt is alert and oriented to person, place, and time. Pt has normal reflexes. No cranial nerve deficit. Motor grossly intact Skin: Skin is warm and dry. No rash noted.  Psychiatric:  Has normal mood and affect. Behavior is normal.     Assessment & Plan:

## 2015-12-07 NOTE — Patient Instructions (Addendum)
Please start the Aspirin 81 mg - 1 per day  You had the flu shot, and the Tetanus (Tdap) shot today  Please continue all other medications as before, and refills have been done if requested.  Please have the pharmacy call with any other refills you may need.  Please continue your efforts at being more active, low cholesterol diet, and weight control.  You are otherwise up to date with prevention measures today.  Please keep your appointments with your specialists as you may have planned  Please return in 1 year for your yearly visit, or sooner if needed, with Lab testing done 3-5 days before

## 2015-12-07 NOTE — Assessment & Plan Note (Signed)

## 2016-07-03 ENCOUNTER — Ambulatory Visit: Payer: Commercial Managed Care - PPO | Admitting: Cardiovascular Disease

## 2016-07-23 ENCOUNTER — Encounter: Payer: Self-pay | Admitting: Cardiovascular Disease

## 2016-07-23 ENCOUNTER — Ambulatory Visit (INDEPENDENT_AMBULATORY_CARE_PROVIDER_SITE_OTHER): Payer: Commercial Managed Care - PPO | Admitting: Cardiovascular Disease

## 2016-07-23 VITALS — BP 189/102 | HR 94 | Ht 63.0 in | Wt 174.6 lb

## 2016-07-23 DIAGNOSIS — K061 Gingival enlargement: Secondary | ICD-10-CM | POA: Diagnosis not present

## 2016-07-23 DIAGNOSIS — E785 Hyperlipidemia, unspecified: Secondary | ICD-10-CM | POA: Diagnosis not present

## 2016-07-23 DIAGNOSIS — I10 Essential (primary) hypertension: Secondary | ICD-10-CM

## 2016-07-23 DIAGNOSIS — Z720 Tobacco use: Secondary | ICD-10-CM | POA: Diagnosis not present

## 2016-07-23 MED ORDER — LISINOPRIL-HYDROCHLOROTHIAZIDE 20-12.5 MG PO TABS: 1.0000 | ORAL_TABLET | Freq: Every day | ORAL | 5 refills | Status: DC

## 2016-07-23 NOTE — Patient Instructions (Signed)
Medication Instructions:  STOP AMLODIPINE   START LISINOPRIL HCT 20/12.5 MG DAILY  Labwork: NONE  Testing/Procedures: NONE  Follow-Up: Your physician recommends that you schedule a follow-up appointment in: 2-3 WEEKS WITH PHARM D   Your physician wants you to follow-up in: 1 YEAR OV  You will receive a reminder letter in the mail two months in advance. If you don't receive a letter, please call our office to schedule the follow-up appointment.   If you need a refill on your cardiac medications before your next appointment, please call your pharmacy.

## 2016-07-23 NOTE — Progress Notes (Signed)
Cardiology Office Note   Date:  07/23/2016   ID:  Cindy Reid, DOB Dec 01, 1950, MRN [id number]  PCP:  Oliver Barre, MD  Cardiologist:   Chilton Si, MD   Chief Complaint  Patient presents with  . Annual Exam    sob; walking fast. edema in right ankle.    History of Present Illness: Cindy Reid is a 65 y.o. female with hypertension, hyperlipidemia, mild carotid stenosis, and palpitations who presents for follow up.  On 05/20/15 Cindy Reid was evaluated in the ED for a syncopal episode.  She was in the bathroom at [facility] with an upset stomach and diarrhea.  After using the bathroom and washing her hands, she was on her way out and the next thing she remembers she was on the floor.  There was no preceding chest pain, shortness of breath, palpitations, or warning. She has never had an episode like this in the past. She is brought to the ED for evaluation and was thought to have had an orthostatic hypotension episode.  Cindy Reid followed up with her primary care doctor who ordered an echocardiogram that was unremarkable aside from grade 1 diastolic dysfunction. A 48 hour Holter 05/2015 revealed sinus rhythm with rare PACs and PVCs.  She also had rare, short-lived, atrial runs that lasted for less than 2 seconds each.  She had a carotid ultrasound that showed mild bilateral ICA stenosis but no signficant obstruction.  At her last appointment she was doing well.  She has noted gingival swelling, And her dentist suggested that she talk with [place] about switching her antihypertensive. She has not noted any chest pain area and she does not exercise regularly. Her dog died one week ago. Prior to that she was walking him regularly and did not have any exertional symptoms. She did sometimes notes some shortness of breath when walking up an incline. She has been trying to limit her carbohydrate intake.   Past Medical History:  Diagnosis Date  . Epilepsy (HCC)   . Hyperlipidemia   .  Hypertension   . Migraine     Past Surgical History:  Procedure Laterality Date  . COLONOSCOPY    . MOUTH SURGERY    . TUBAL LIGATION       Current Outpatient Prescriptions  Medication Sig Dispense Refill  . aspirin EC 81 MG tablet Take 1 tablet (81 mg total) by mouth daily. 90 tablet 11  . atorvastatin (LIPITOR) 20 MG tablet Take 1 tablet (20 mg total) by mouth daily. 90 tablet 3  . lisinopril-hydrochlorothiazide (PRINZIDE,ZESTORETIC) 20-12.5 MG tablet Take 1 tablet by mouth daily. 30 tablet 5   No current facility-administered medications for this visit.     Allergies:   Amlodipine and Dilantin [phenytoin]    Social History:  The patient  reports that she has been smoking Cigarettes.  She has been smoking about 2.00 packs per day. She does not have any smokeless tobacco history on file. She reports that she drinks alcohol. She reports that she does not use drugs.   Family History:  [name] family history includes Cancer in her father and mother; Colon cancer in her maternal aunt; Diabetes in her mother; Heart disease in her father.    ROS:  Please see the history of present illness.  Otherwise, review of systems are positive for none.   All other systems are reviewed and negative.    PHYSICAL EXAM: VS:  BP (!) 189/102   Pulse 94   Ht  5\' 3"  (1.6 m)   Wt 174 lb 9.6 oz (79.2 kg)   BMI 30.93 kg/m  , BMI Body mass index is 30.93 kg/m. GENERAL:  Well appearing HEENT:  Pupils equal round and reactive, fundi not visualized, oral mucosa unremarkable.  Gingival hyperplasia NECK:  No jugular venous distention, waveform within normal limits, carotid upstroke brisk and symmetric, mild R carotid bruits LYMPHATICS:  No cervical adenopathy LUNGS:  Clear to auscultation bilaterally HEART:  RRR.  PMI not displaced or sustained,S1 and S2 within normal limits, no S3, no S4, no clicks, no rubs, no murmurs ABD:  Flat, positive bowel sounds normal in frequency in pitch, no bruits, no  rebound, no guarding, no midline pulsatile mass, no hepatomegaly, no splenomegaly EXT:  2 plus pulses throughout, no edema, no cyanosis no clubbing SKIN:  No rashes no nodules NEURO:  Cranial nerves II through XII grossly intact, motor grossly intact throughout PSYCH:  Cognitively intact, oriented to person place and time   EKG:  EKG is ordered today. The ekg ordered today demonstrates sinus tachycardia at 104 bpm.    Carotid ultrasound 06/22/15: 1-39% bilateral ICA stenosis  TTE 06/20/15: LVEF 55-60%.  Moderate LVH.  Trivial AR.     Recent Labs: 12/01/2015: ALT 10; BUN 11; Creatinine, Ser 1.00; Hemoglobin 15.1; Platelets 249.0; Potassium 3.7; Sodium 139; TSH 2.07    Lipid Panel    Component Value Date/Time   CHOL 184 12/01/2015 0847   TRIG 159.0 (H) 12/01/2015 0847   HDL 45.30 12/01/2015 0847   CHOLHDL 4 12/01/2015 0847   VLDL 31.8 12/01/2015 0847   LDLCALC 107 (H) 12/01/2015 0847   LDLDIRECT 190.0 06/01/2015 1725      Wt Readings from Last 3 Encounters:  07/23/16 174 lb 9.6 oz (79.2 kg)  12/07/15 172 lb (78 kg)  08/03/15 175 lb (79.4 kg)      Other studies Reviewed: Additional studies/ records that were reviewed today include: medical record . Review of the above records demonstrates:  Please see elsewhere in the note.     ASSESSMENT AND PLAN:  # Hypertension: At the last appointment amlodipine was increased to 10 mg daily. Her blood pressure was initially quite elevated and on repeat was 144/68. Given that her blood pressures remain elevated and she has developed gingival hyperplasia, we will stop amlodipine and start lisinopril 20/I to quit thiazide 12.5 mg daily. She will return in 2-3 weeks for blood pressure check with pharmacy. At that time she will need a basic metabolic panel checked as well.   # Hyperlipidemia: Managed by Dr. Jonny RuizJohn.  Agree with atorvastatin. 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 She will continue to think about this and we will rediscuss it at her next appointment.  # Carotid bruit: No obstructive carotid disease on carotid ultrasound.  Continue aspirin and atorvastatin.   Current medicines are reviewed at length with the patient today.  The patient does not have concerns regarding medicines.  The following changes have been made:  Stop amlodipine.  Start lisinopril/HCTZ.  Labs/ tests ordered today include:   Orders Placed This Encounter  Procedures  . EKG 12-Lead     Disposition:   FU with Dr. Elmarie Shileyiffany C. Duke SalviaRandolph in 1 year.   Signed, Chilton Siiffany [place], MD  07/23/2016 9:47 AM    [facility]

## 2016-08-09 ENCOUNTER — Other Ambulatory Visit: Payer: Self-pay | Admitting: Internal Medicine

## 2016-08-15 ENCOUNTER — Ambulatory Visit (INDEPENDENT_AMBULATORY_CARE_PROVIDER_SITE_OTHER): Payer: Commercial Managed Care - PPO | Admitting: Pharmacist

## 2016-08-15 VITALS — BP 114/80 | HR 94

## 2016-08-15 DIAGNOSIS — I10 Essential (primary) hypertension: Secondary | ICD-10-CM | POA: Diagnosis not present

## 2016-08-15 LAB — BASIC METABOLIC PANEL
BUN: 12 mg/dL (ref 7–25)
CALCIUM: 9.3 mg/dL (ref 8.6–10.4)
CO2: 29 mmol/L (ref 20–31)
Chloride: 96 mmol/L — ABNORMAL LOW (ref 98–110)
Creat: 1.17 mg/dL — ABNORMAL HIGH (ref 0.50–0.99)
GLUCOSE: 81 mg/dL (ref 65–99)
Potassium: 3.8 mmol/L (ref 3.5–5.3)
Sodium: 132 mmol/L — ABNORMAL LOW (ref 135–146)

## 2016-08-15 MED ORDER — VALSARTAN 80 MG PO TABS: 80.0000 mg | ORAL_TABLET | Freq: Every day | ORAL | 1 refills | Status: DC

## 2016-08-15 NOTE — Patient Instructions (Signed)
Return for a a follow up appointment in 4 weeks  Check your blood pressure at home daily (if able) and keep record of the readings.  Take your BP meds as follows: STOP lisinopril/HCTZ START valsartan 80mg  once daily  Bring all of your meds, your BP cuff and your record of home blood pressures to your next appointment.  Exercise as you're able, try to walk approximately 30 minutes per day.  Keep salt intake to a minimum, especially watch canned and prepared boxed foods.  Eat more fresh fruits and vegetables and fewer canned items.  Avoid eating in fast food restaurants.    HOW TO TAKE YOUR BLOOD PRESSURE: . Rest 5 minutes before taking your blood pressure. .  Don't smoke or drink caffeinated beverages for at least 30 minutes before. . Take your blood pressure before (not after) you eat. . Sit comfortably with your back supported and both feet on the floor (don't cross your legs). . Elevate your arm to heart level on a table or a desk. . Use the proper sized cuff. It should fit smoothly and snugly around your bare upper arm. There should be enough room to slip a fingertip under the cuff. The bottom edge of the cuff should be 1 inch above the crease of the elbow. . Ideally, take 3 measurements at one sitting and record the average.

## 2016-08-15 NOTE — Progress Notes (Signed)
Patient ID: Cindy Reid                 DOB: 10-30-1950                      MRN: [id number]     HPI: Cindy Reid is a 65 y.o. female patient of Dr. Duke Salvia with PMH below who presents today for hypertension evaluation. She was seen by Dr. Duke Salvia recently at which time her pressure was markedly elevated. 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   In clinic today, pt reports that she does not feel she is tolerating the new medication well. She complains of a dry cough that is new and that she occassionally begins sweating. She also endorses frequent urination and occasional incontinence due to coughing and increased urination. Additionally, she states that in the mornings it takes her a while to "not feel fuzzy" and that she needs time before she feels steady since starting this medication.   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 5 days after starting the new medication, she was at breakfast on vacation with her family when she experienced a seizure-like episode. Her daughter-in-law reports shaking and unresponsiveness for 2-3 minutes along with decreased breathing. She declined going to ER when EMS arrived. She does admit that she was warm, the restaurant was crowded and she had not eaten that morning when the episode occurred. Pt was curious if either of these two medications could induce seizures.  Cardiac Hx: HTN, syncope, CKD, HLD  Current HTN meds:  Lisinopril/HCTZ 20/12.5mg  daily  Previously tried:  Amlodipine - gingival hyperplasia  BP goal: <150/90  Family History: Pt is unsure if her parents had HTN but that it was likely. She reports her mother had DM. Neither of her sons have HTN as far as she is aware.   Social History: Pt reports that she smokes "too much" but would not disclose how much she is currently smoking. She  has attempted to quit in the past and has been unsuccessful. It has been 2-3 years since she has attempted to quit and is not currently interested. She denies smokeless tobacco and she only drinks 1-2 drinks every few weeks.   Diet: Pt reports eating more of her meals at home. She uses salts and seasonings while cooking and rarely adds salt at the table. When she eats out, it is mainly to bars and grills type of places and has significantly cut the number of carbs she eats.   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   Home BP readings: Since starting the new medication, pts home BP has been running from 100s-130s/60s-70s. She only has a few readings above goal that are 140s/70s and a mix of morning and night. She also has had one episode of low BP of 90/58 one morning.  Wt Readings from Last 3 Encounters:  07/23/16 174 lb 9.6 oz (79.2 kg)  12/07/15 172 lb (78 kg)  08/03/15 175 lb (79.4 kg)   BP Readings from Last 3 Encounters:  08/15/16 114/80  07/23/16 (!) 189/102  12/07/15 (!) 142/88   Pulse Readings from Last 3 Encounters:  08/15/16 94  07/23/16 94  12/07/15 94    Renal function: CrCl cannot be calculated (Unknown ideal weight.).  Past Medical  History:  Diagnosis Date  . Epilepsy (HCC)   . Hyperlipidemia   . Hypertension   . Migraine     Current Outpatient Prescriptions on File Prior to Visit  Medication Sig Dispense Refill  . aspirin EC 81 MG tablet Take 1 tablet (81 mg total) by mouth daily. 90 tablet 11  . atorvastatin (LIPITOR) 20 MG tablet TAKE 1 TABLET DAILY 90 tablet 3   No current facility-administered medications on file prior to visit.     Allergies  Allergen Reactions  . Amlodipine     gingival hyperplasia  . Dilantin [Phenytoin] Other (See Comments)    UNSURE OF REACTION    Blood pressure 114/80, pulse 94.   Assessment/Plan: Hypertension: BP today in clinic is at goal. Because she is complaining of  medication side effects and symptoms of hypotension in the mornings, will switch her current regimen to valsartan 80 mg daily. Will defer starting diuretic due to urinary symptoms and she will monitor fluid retention to determine need to resume. Will also send her to the lab today for BMET. Will follow up with pt in 4 weeks for BP check.    Thank you, Freddie Apley. Cleatis Polka, PharmD  [facility]  08/15/2016 11:26 AM

## 2016-08-26 ENCOUNTER — Telehealth: Payer: Self-pay | Admitting: Cardiovascular Disease

## 2016-08-26 DIAGNOSIS — N183 Chronic kidney disease, stage 3 unspecified: Secondary | ICD-10-CM

## 2016-08-26 LAB — BASIC METABOLIC PANEL
BUN: 7 mg/dL (ref 7–25)
CHLORIDE: 106 mmol/L (ref 98–110)
CO2: 25 mmol/L (ref 20–31)
Calcium: 8.8 mg/dL (ref 8.6–10.4)
Creat: 1.15 mg/dL — ABNORMAL HIGH (ref 0.50–0.99)
GLUCOSE: 77 mg/dL (ref 65–99)
POTASSIUM: 4.4 mmol/L (ref 3.5–5.3)
SODIUM: 139 mmol/L (ref 135–146)

## 2016-08-26 NOTE — Telephone Encounter (Signed)
Returned call to patient. Gave her the results of BMP and that she needs repeat blood work. Order entered. She will get lab work today.  She also stated she has gained 5 lb since stopping the lisinopril/HCTZ on 10/19 and beginning the valsartan on 10/20. Denies swelling in hands/feet/ankles, denies SOB. Has dry cough which has been ongoing since initially starting lisinopril/hctz originally. Says she has been eating more carbs/bread over the past few weeks and previously was eating low carb diet.  Recent Blood pressures (am readings were taken prior to taking her valsartan): today 145/84, HR 77. 10/24  BP 145/86 in am; 127/64 in pm. 10/25  BP 132/76 in am 10/26  BP 145/86 in am; 154/78 in pm.  Patient will go get labwork this morning. Routing to Dr Duke Salviaandolph for review.

## 2016-08-26 NOTE — Telephone Encounter (Signed)
New message ° ° ° °Pt verbalized that she is returning call for rn  °

## 2016-08-26 NOTE — Telephone Encounter (Signed)
-----   Message from Chilton Siiffany [place], MD sent at 08/22/2016  5:08 PM EDT ----- Sodium levels are a little low and her kidney function is mildly elevated.  This can be a sign that she is dehydrated. Please repeat BMP now that she is off HCTZ.

## 2016-09-03 MED ORDER — HYDROCHLOROTHIAZIDE 12.5 MG PO CAPS: ORAL_CAPSULE | ORAL | 5 refills | Status: DC

## 2016-09-03 MED ORDER — HYDROCHLOROTHIAZIDE 12.5 MG PO TABS: ORAL_TABLET | ORAL | 5 refills | Status: DC

## 2016-09-03 NOTE — Telephone Encounter (Signed)
Given that her blood pressure remains a little bit above goal (140/90), restart hydrochlorothiazide 6.25 mg daily. This might also help with the weight gain.

## 2016-09-03 NOTE — Telephone Encounter (Signed)
Advised patient of medication change.

## 2016-09-12 ENCOUNTER — Ambulatory Visit (INDEPENDENT_AMBULATORY_CARE_PROVIDER_SITE_OTHER): Payer: Commercial Managed Care - PPO | Admitting: Pharmacist

## 2016-09-12 VITALS — BP 174/106 | HR 87

## 2016-09-12 DIAGNOSIS — I10 Essential (primary) hypertension: Secondary | ICD-10-CM

## 2016-09-12 MED ORDER — VALSARTAN 80 MG PO TABS: 80.0000 mg | ORAL_TABLET | Freq: Every day | ORAL | 1 refills | Status: DC

## 2016-09-12 NOTE — Progress Notes (Signed)
Patient ID: Cindy HumbleGloria J Santerre                 DOB: 09/21/1951                      MRN: [id number]     HPI: Cindy Reid is a 65 y.o. female patient of Dr. Duke Salviaandolph with PMH below who presents today for hypertension follow up. Since our last visit (when she was changed from lisinopril/HCTZ to valsartan alone), she has resumed a very low dose of HCTZ 6.25mg  daily.   She reports feeling no different, but that her pressures have been running high. She called and Dr. Duke Salviaandolph restarted low dose of HCTZ since pressures were high. She states she was confused because she thought the new prescription contained both components (ARB and diuretic), so she has only been taking 1/2 of the new tablet she picked up on the 9th of this month.   She has noticed that she is using the bathroom more with the diuretic, but she feels it is needed because she did put on a little weight after stopping. She is back down to about the same weight today.   Cardiac Hx: HTN, syncope, CKD, HLD  Current HTN meds:  Valsartan 80mg  daily - has not been taking  Restarted low dose of HCTZ 6.25mg  daily about 1 week ago.   Previously tried:  Amlodipine - gingival hyperplasia HCTZ - urinary symptoms  BP goal: <130/80 based on new guidelines  Family History: Pt is unsure if her parents had HTN but that it was likely. She reports her mother had DM. Neither of her sons have HTN as far as she is aware.   Social History: Pt reports that she smokes "too much" but would not disclose how much she is currently smoking. She has attempted to quit in the past and has been unsuccessful. It has been 2-3 years since she has attempted to quit and is not currently interested. She denies smokeless tobacco and she only drinks 1-2 drinks every few weeks.   Diet: Pt reports eating more of her meals at home. She uses salts and seasonings while cooking and rarely adds salt at the table. When she eats out, it is mainly to bars and grills type of  places and has significantly cut the number of carbs she eats.   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   Home BP readings:  Her pressures are elevated consistently at home. Most in the 160-170s/ 90-100s. She never can tell that her pressure is elevated and states the only way she found out was at the dentist when her pressure was "through the ceiling."  HR WNL.   Wt Readings from Last 3 Encounters:  07/23/16 174 lb 9.6 oz (79.2 kg)  12/07/15 172 lb (78 kg)  08/03/15 175 lb (79.4 kg)   BP Readings from Last 3 Encounters:  09/12/16 (!) 174/106  08/15/16 114/80  07/23/16 (!) 189/102   Pulse Readings from Last 3 Encounters:  09/12/16 87  08/15/16 94  07/23/16 94    Renal function: CrCl cannot be calculated (Unknown ideal weight.).  Past Medical History:  Diagnosis Date  . Epilepsy (HCC)   . Hyperlipidemia   . Hypertension   . Migraine     Current Outpatient Prescriptions on File Prior to Visit  Medication Sig Dispense Refill  . aspirin EC 81 MG tablet Take 1 tablet (81 mg total)  by mouth daily. 90 tablet 11  . atorvastatin (LIPITOR) 20 MG tablet TAKE 1 TABLET DAILY 90 tablet 3  . hydrochlorothiazide (HYDRODIURIL) 12.5 MG tablet 1/2 tablet by mouth daily 15 tablet 5   No current facility-administered medications on file prior to visit.     Allergies  Allergen Reactions  . Amlodipine     gingival hyperplasia  . Dilantin [Phenytoin] Other (See Comments)    UNSURE OF REACTION    Blood pressure (!) 174/106, pulse 87.   Assessment/Plan: Hypertension: BP markedly elevated today, which appears to be consistent with home readings. She has not been taking her valsartan. Will resume valsartan 80mg  daily with hydrochlorothiazide 6.25mg  daily. She will likely need to have her valsartan titrated at next visit since pressure has been so elevated, but she is hesitant to do this since she just restarted HCTZ. Will see her back in  2-3 weeks for BMET and BP check after resume valsartan with HCTZ.    Thank you, Freddie ApleyKelley M. Cleatis PolkaAuten, PharmD  [facility]  09/12/2016 9:34 AM

## 2016-09-12 NOTE — Patient Instructions (Signed)
Return for a follow up appointment in 2-3 week for blood work and blood pressure check   Check your blood pressure at home daily (if able) and keep record of the readings.  Take your BP meds as follows: RESTART valsartan 80mg  (1 tablet) daily CONTINUE hydrochlorothiazide 6.25mg  (1/2 tablet) daily   Bring all of your meds, your BP cuff and your record of home blood pressures to your next appointment.  Exercise as you're able, try to walk approximately 30 minutes per day.  Keep salt intake to a minimum, especially watch canned and prepared boxed foods.  Eat more fresh fruits and vegetables and fewer canned items.  Avoid eating in fast food restaurants.    HOW TO TAKE YOUR BLOOD PRESSURE: . Rest 5 minutes before taking your blood pressure. .  Don't smoke or drink caffeinated beverages for at least 30 minutes before. . Take your blood pressure before (not after) you eat. . Sit comfortably with your back supported and both feet on the floor (don't cross your legs). . Elevate your arm to heart level on a table or a desk. . Use the proper sized cuff. It should fit smoothly and snugly around your bare upper arm. There should be enough room to slip a fingertip under the cuff. The bottom edge of the cuff should be 1 inch above the crease of the elbow. . Ideally, take 3 measurements at one sitting and record the average.

## 2016-09-13 ENCOUNTER — Encounter: Payer: Self-pay | Admitting: Pharmacist

## 2016-09-30 ENCOUNTER — Ambulatory Visit (INDEPENDENT_AMBULATORY_CARE_PROVIDER_SITE_OTHER): Payer: Commercial Managed Care - PPO | Admitting: Pharmacist Clinician (PhC)/ Clinical Pharmacy Specialist

## 2016-09-30 VITALS — BP 162/90 | HR 88

## 2016-09-30 DIAGNOSIS — I10 Essential (primary) hypertension: Secondary | ICD-10-CM

## 2016-09-30 NOTE — Patient Instructions (Signed)
Return for a a follow up appointment in 6 weeks  Your blood pressure today is 162/90 (goal is < 130/80)  Check your blood pressure at home daily and keep record of the readings.  Take your BP meds as follows:  Increase valsartan to 160 mg daily ( take 2 of the 80 mg tabs until they are gone)  Continue with hydrochlorothiazide 6.25 mg daily  Get blood work drawn at lab in about 10-14 days  Bring all of your meds, your BP cuff and your record of home blood pressures to your next appointment.  Exercise as you're able, try to walk approximately 30 minutes per day.  Keep salt intake to a minimum, especially watch canned and prepared boxed foods.  Eat more fresh fruits and vegetables and fewer canned items.  Avoid eating in fast food restaurants.    HOW TO TAKE YOUR BLOOD PRESSURE: . Rest 5 minutes before taking your blood pressure. .  Don't smoke or drink caffeinated beverages for at least 30 minutes before. . Take your blood pressure before (not after) you eat. . Sit comfortably with your back supported and both feet on the floor (don't cross your legs). . Elevate your arm to heart level on a table or a desk. . Use the proper sized cuff. It should fit smoothly and snugly around your bare upper arm. There should be enough room to slip a fingertip under the cuff. The bottom edge of the cuff should be 1 inch above the crease of the elbow. . Ideally, take 3 measurements at one sitting and record the average.

## 2016-09-30 NOTE — Progress Notes (Signed)
Patient ID: Cindy Reid                 DOB: 02/05/1951                      MRN: [id number]     HPI: Cindy HumbleGloria J Manges is a 65 y.o. female patient of Dr. Duke Salviaandolph with PMH below who presents today for hypertension follow up. She is currently taking valsartan 80 mg and hctz 6.25 mg. She has been feeling well and has no complaints about her medications today.  Previously she took hctz 12.5 mg but noted that she had to use the bathroom more frequently and does not wish to go above the current 6.25 mg dose.    Cardiac Hx: HTN, syncope, CKD, HLD  Current HTN meds:  Valsartan 80mg  daily HCTZ 6.25 mg daily.   Previously tried:  Amlodipine - gingival hyperplasia HCTZ - urinary frequency with dose > 6.25 mg  BP goal: <130/80 based on new guidelines  Family History: Pt is unsure if her parents had HTN but that it was likely. She reports her mother had DM. Neither of her sons have HTN as far as she is aware.   Social History: Pt reports that she smokes "too much" but would not disclose how much she is currently smoking. She has attempted to quit in the past and has been unsuccessful. No interest in quitting at this time. She denies smokeless tobacco and she only drinks 1-2 drinks every few weeks.   Diet: Pt reports eating more of her meals at home. She uses salts and seasonings while cooking and rarely adds salt at the table. When she eats out, it is mainly to bars and grills type of places and has significantly cut the number of carbs she eats.   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  Now that one of her dogs has died, she admits to being more sedentary (she doesn't have to take him for a walk now)   Home BP readings:  Her pressures are improved some, but still not to goal.  Twenty-one readings over past 10 days show a range of 111-158/63-88 with an average of 145/84.  HR WNL.   Wt Readings from Last 3 Encounters:  07/23/16 174 lb 9.6 oz (79.2 kg)    12/07/15 172 lb (78 kg)  08/03/15 175 lb (79.4 kg)   BP Readings from Last 3 Encounters:  09/12/16 (!) 174/106  08/15/16 114/80  07/23/16 (!) 189/102   Pulse Readings from Last 3 Encounters:  09/12/16 87  08/15/16 94  07/23/16 94    Renal function: CrCl cannot be calculated (Patient's most recent lab result is older than the maximum 21 days allowed.).  Past Medical History:  Diagnosis Date  . Epilepsy (HCC)   . Hyperlipidemia   . Hypertension   . Migraine     Current Outpatient Prescriptions on File Prior to Visit  Medication Sig Dispense Refill  . aspirin EC 81 MG tablet Take 1 tablet (81 mg total) by mouth daily. 90 tablet 11  . atorvastatin (LIPITOR) 20 MG tablet TAKE 1 TABLET DAILY 90 tablet 3  . hydrochlorothiazide (HYDRODIURIL) 12.5 MG tablet 1/2 tablet by mouth daily 15 tablet 5  . valsartan (DIOVAN) 80 MG tablet Take 1 tablet (80 mg total) by mouth daily. 30 tablet 1   No current facility-administered medications on file prior to visit.     Allergies  Allergen Reactions  .  Amlodipine     gingival hyperplasia  . Dilantin [Phenytoin] Other (See Comments)    UNSURE OF REACTION    There were no vitals taken for this visit.   Assessment/Plan: Hypertension: BP markedly elevated today, which appears to be consistent with home readings. She has not been taking her valsartan. Will resume valsartan 80mg  daily with hydrochlorothiazide 6.25mg  daily. She will likely need to have her valsartan titrated at next visit since pressure has been so elevated, but she is hesitant to do this since she just restarted HCTZ. Will see her back in 2-3 weeks for BMET and BP check after resume valsartan with HCTZ.    Thank you, Freddie ApleyKelley M. Cleatis PolkaAuten, PharmD  [facility]  09/30/2016 9:18 AM

## 2016-10-01 ENCOUNTER — Encounter: Payer: Self-pay | Admitting: Pharmacist Clinician (PhC)/ Clinical Pharmacy Specialist

## 2016-10-01 NOTE — Assessment & Plan Note (Signed)
While her BP has improved with both the valsartan and hctz, she is still not to current goal.  Will have her increase then valsartan to 160 mg once daily and continue with home BP monitoring.  Will see her back in 6 weeks for follow up.

## 2016-10-07 ENCOUNTER — Other Ambulatory Visit: Payer: Self-pay | Admitting: Pharmacist Clinician (PhC)/ Clinical Pharmacy Specialist

## 2016-10-07 MED ORDER — VALSARTAN 160 MG PO TABS: 160.0000 mg | ORAL_TABLET | Freq: Every day | ORAL | 5 refills | Status: DC

## 2016-10-08 IMAGING — CR DG CHEST 2V
2 series · 2 of 2 positions shown · non-contrast
Comparison: None.

CLINICAL DATA: Recent syncopal episode, history of tobacco use

EXAM:
CHEST - 2 VIEW

[chest pa]
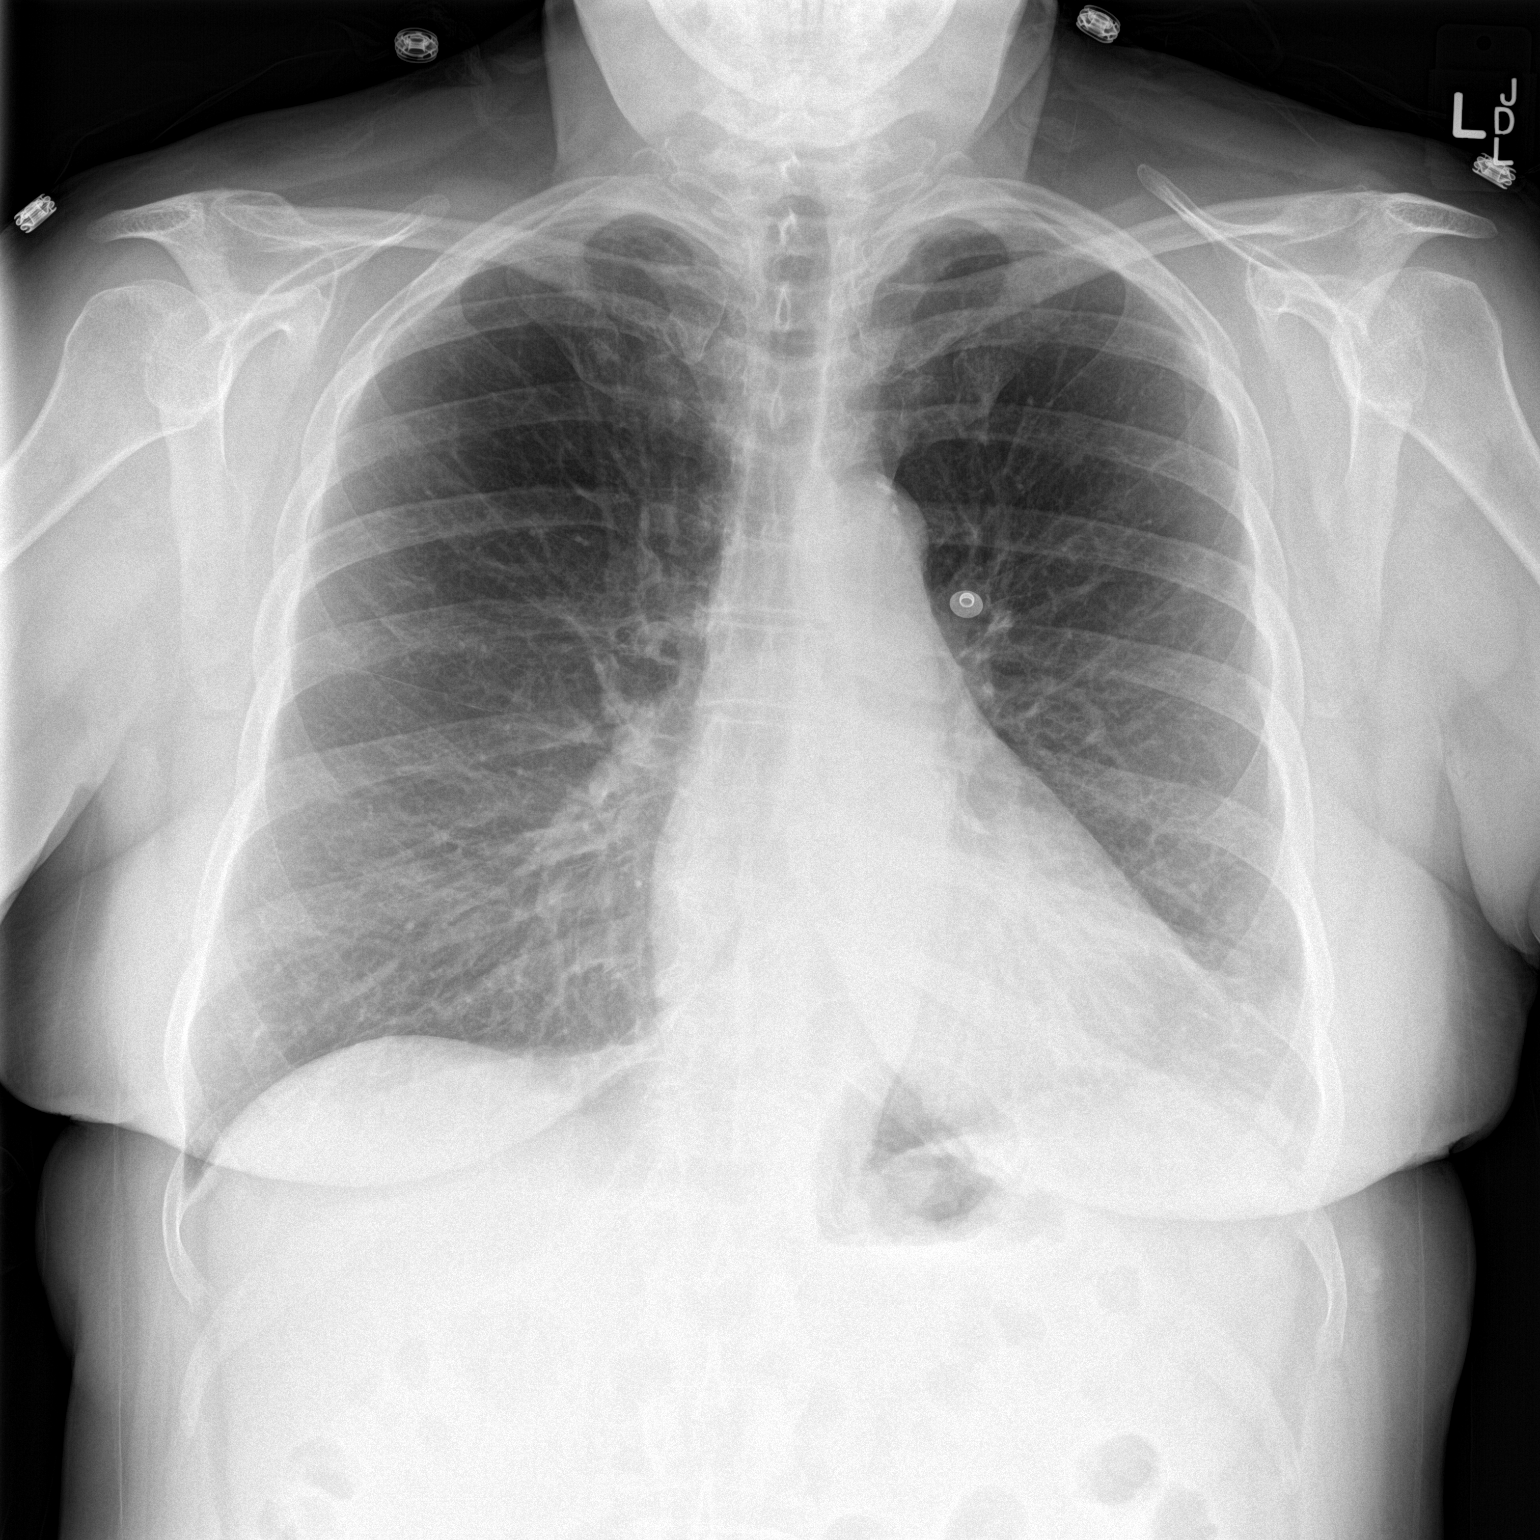

[chest lat]
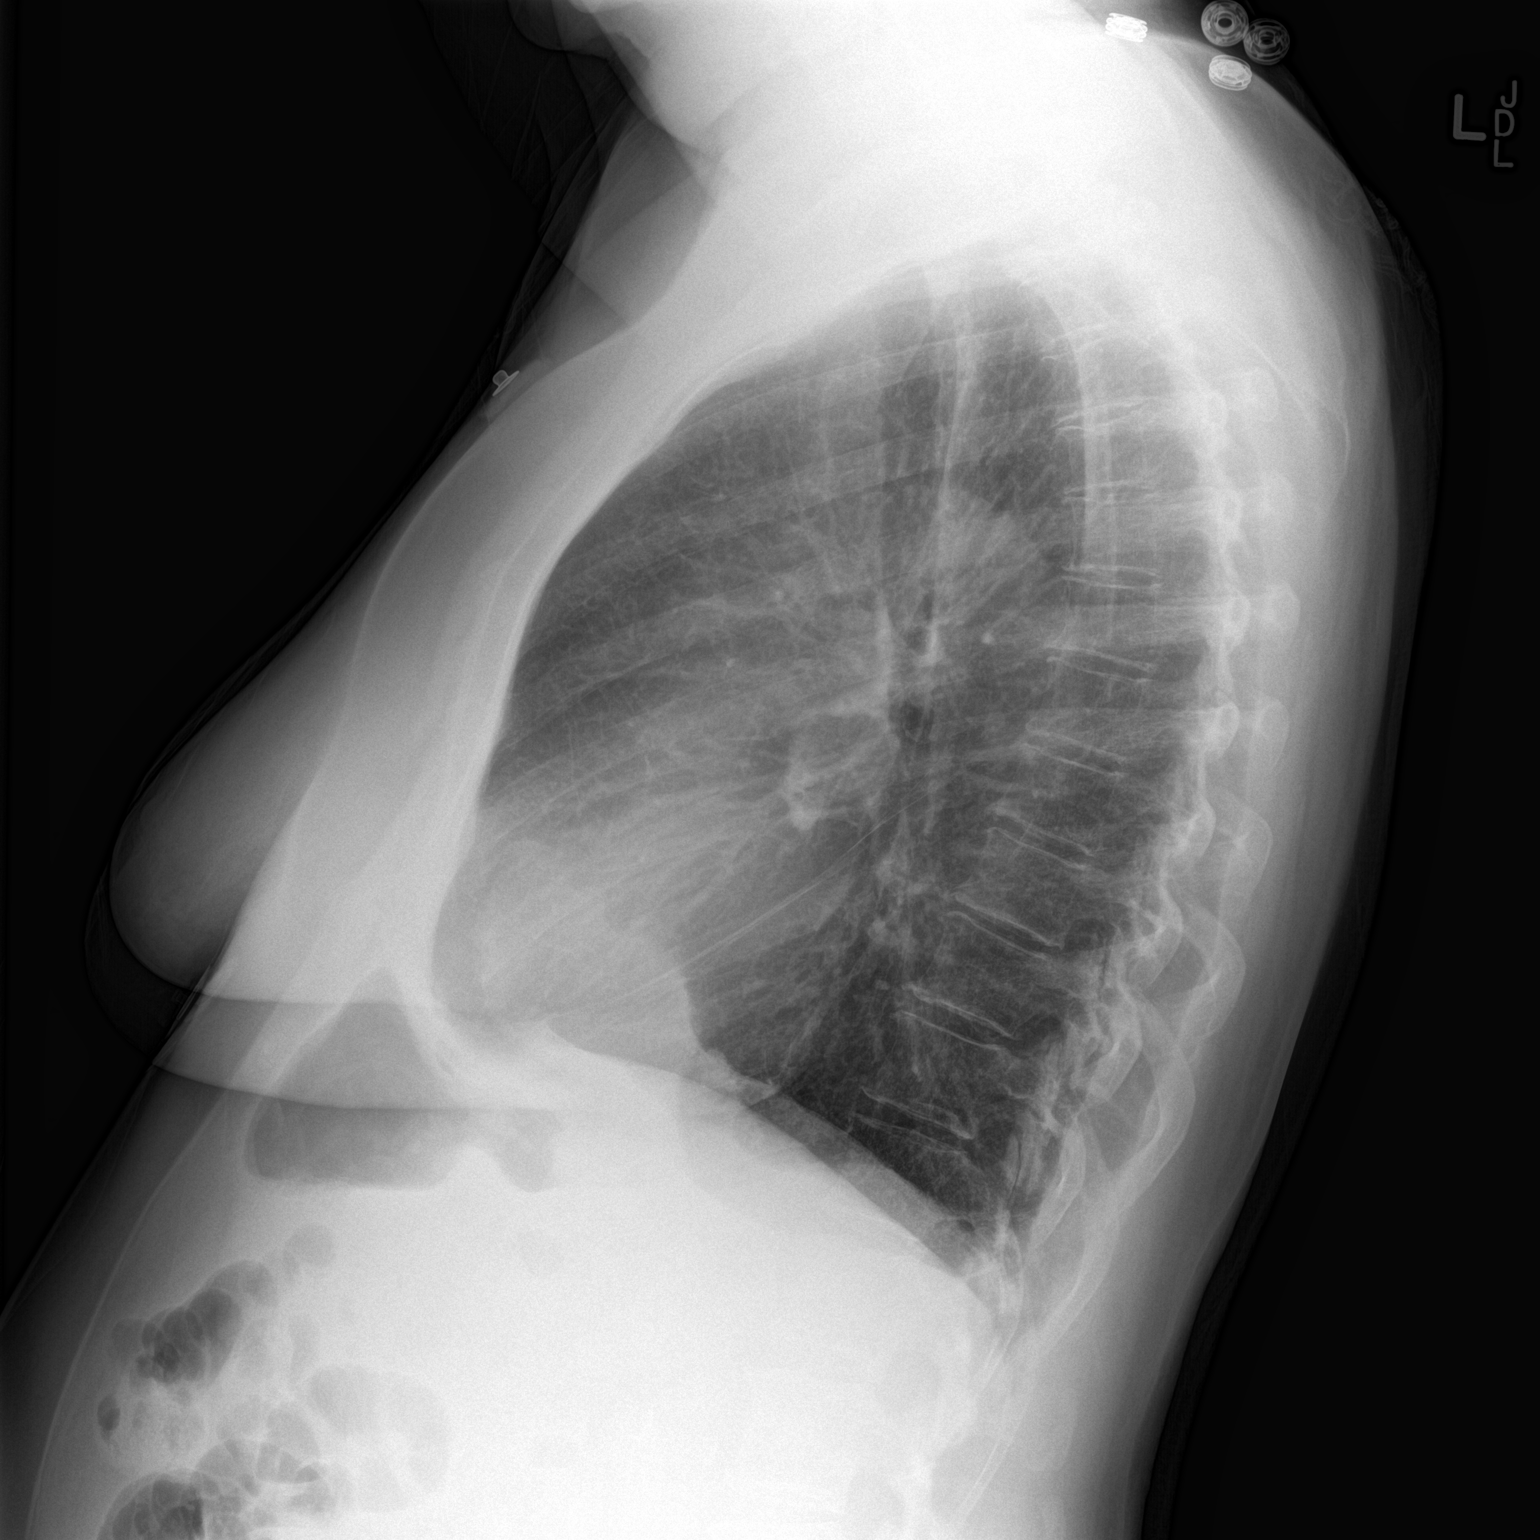

[2 of 2 positions shown; findings below may reference images not displayed]

FINDINGS: The heart size and mediastinal contours are within normal limits.
Both lungs are clear. The visualized skeletal structures are
unremarkable.
IMPRESSION: No active disease.

## 2016-10-10 ENCOUNTER — Other Ambulatory Visit: Payer: Self-pay | Admitting: Cardiovascular Disease

## 2016-10-10 LAB — BASIC METABOLIC PANEL
BUN: 11 mg/dL (ref 7–25)
CALCIUM: 8.8 mg/dL (ref 8.6–10.4)
CHLORIDE: 102 mmol/L (ref 98–110)
CO2: 28 mmol/L (ref 20–31)
Creat: 1.33 mg/dL — ABNORMAL HIGH (ref 0.50–0.99)
Glucose, Bld: 139 mg/dL — ABNORMAL HIGH (ref 65–99)
POTASSIUM: 4.9 mmol/L (ref 3.5–5.3)
SODIUM: 136 mmol/L (ref 135–146)

## 2016-11-12 ENCOUNTER — Other Ambulatory Visit: Payer: Self-pay | Admitting: Cardiovascular Disease

## 2016-11-12 ENCOUNTER — Ambulatory Visit (INDEPENDENT_AMBULATORY_CARE_PROVIDER_SITE_OTHER): Payer: Commercial Managed Care - PPO | Admitting: Pharmacist Clinician (PhC)/ Clinical Pharmacy Specialist

## 2016-11-12 VITALS — BP 146/80 | HR 80

## 2016-11-12 DIAGNOSIS — I10 Essential (primary) hypertension: Secondary | ICD-10-CM | POA: Diagnosis not present

## 2016-11-12 LAB — BASIC METABOLIC PANEL
BUN: 15 mg/dL (ref 7–25)
CO2: 28 mmol/L (ref 20–31)
Calcium: 9.2 mg/dL (ref 8.6–10.4)
Chloride: 99 mmol/L (ref 98–110)
Creat: 1.35 mg/dL — ABNORMAL HIGH (ref 0.50–0.99)
Glucose, Bld: 90 mg/dL (ref 65–99)
Potassium: 5.1 mmol/L (ref 3.5–5.3)
Sodium: 136 mmol/L (ref 135–146)

## 2016-11-12 MED ORDER — VALSARTAN 320 MG PO TABS: 320.0000 mg | ORAL_TABLET | Freq: Every day | ORAL | 5 refills | Status: DC

## 2016-11-12 NOTE — Progress Notes (Signed)
Patient ID: Cindy Reid                 DOB: 28-Dec-1950                      MRN: [id number]     HPI: Cindy Reid is a 66 y.o. female patient of Dr. Duke Salvia with PMH below who presents today for hypertension follow up. She is currently taking valsartan 160 mg and hctz 6.25 mg. She has been feeling well and has no complaints about her medications today.  Previously she took hctz 12.5 mg but noted that she had to use the bathroom more frequently and does not wish to go above the current 6.25 mg dose.   Her only complaint today involves her hands discoloring when they get cold.     Cardiac Hx: HTN, syncope, CKD, HLD  Current HTN meds:  Valsartan 160 mg daily HCTZ 6.25 mg daily.   Previously tried:  Amlodipine - gingival hyperplasia HCTZ - urinary frequency with dose > 6.25 mg  BP goal: <130/80  Family History: Pt is unsure if her parents had HTN but that it was likely. She reports her mother had DM. Neither of her sons have HTN as far as she is aware.   Social History: Pt reports that she smokes "too much" but will not disclose how much she is currently smoking. She has attempted to quit in the past and has been unsuccessful. No interest in quitting at this time. She denies smokeless tobacco and enodrses occasional alcoholic drink   Diet: Pt reports eating more of her meals at home. She uses salts and seasonings while cooking and rarely adds salt at the table. She admits to gaining weight in the past month, she is unsure if it is because he husband is now home (was working out of town for [employer]), the holidays, or her increase in valsartan.    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  Now that one of her dogs has died, she admits to being more sedentary (she doesn't have to take him for a walk now)   Home BP readings:  Her pressures are improved some, with the average now at 132/70.  However she still had 5/22 readings >140 systolic and only 3 <  120.    HR WNL.   Wt Readings from Last 3 Encounters:  07/23/16 174 lb 9.6 oz (79.2 kg)  12/07/15 172 lb (78 kg)  08/03/15 175 lb (79.4 kg)   BP Readings from Last 3 Encounters:  11/12/16 (!) 146/80  09/30/16 (!) 162/90  09/12/16 (!) 174/106   Pulse Readings from Last 3 Encounters:  11/12/16 80  09/30/16 88  09/12/16 87    Renal function: CrCl cannot be calculated (Patient's most recent lab result is older than the maximum 21 days allowed.).  Past Medical History:  Diagnosis Date  . Epilepsy (HCC)   . Hyperlipidemia   . Hypertension   . Migraine     Current Outpatient Prescriptions on File Prior to Visit  Medication Sig Dispense Refill  . aspirin EC 81 MG tablet Take 1 tablet (81 mg total) by mouth daily. 90 tablet 11  . atorvastatin (LIPITOR) 20 MG tablet TAKE 1 TABLET DAILY 90 tablet 3  . hydrochlorothiazide (HYDRODIURIL) 12.5 MG tablet 1/2 tablet by mouth daily 15 tablet 5   No current facility-administered medications on file prior to visit.     Allergies  Allergen  Reactions  . Amlodipine     gingival hyperplasia  . Dilantin [Phenytoin] Other (See Comments)    UNSURE OF REACTION    Blood pressure (!) 146/80, pulse 80.   Assessment/Plan:  Patient with CKD and hypertension, with improved pressures.  In the office her reading today was 146/80.  Will have her increase the valsartan to 320 mg from 160 mg at this time.  She is also to go to the lab for a BMET, as there was a slight bump in her creatinine after the last dose increase.  Will see her back in a month for follow up.     Thank you, Freddie ApleyKelley M. Cleatis PolkaAuten, PharmD  [facility]  11/12/2016 11:02 AM

## 2016-11-12 NOTE — Patient Instructions (Signed)
Return for a a follow up appointment in 1 month  Your blood pressure today is 146/80 (goal is < 130/80)  Check your blood pressure at home daily and keep record of the readings.  Take your BP meds as follows:  Increase the valsartan to 320 mg daily  Continue with the hydrochlorothiazide 6.25 mg daily  Bring all of your meds, your BP cuff and your record of home blood pressures to your next appointment.  Exercise as you're able, try to walk approximately 30 minutes per day.  Keep salt intake to a minimum, especially watch canned and prepared boxed foods.  Eat more fresh fruits and vegetables and fewer canned items.  Avoid eating in fast food restaurants.    HOW TO TAKE YOUR BLOOD PRESSURE: . Rest 5 minutes before taking your blood pressure. .  Don't smoke or drink caffeinated beverages for at least 30 minutes before. . Take your blood pressure before (not after) you eat. . Sit comfortably with your back supported and both feet on the floor (don't cross your legs). . Elevate your arm to heart level on a table or a desk. . Use the proper sized cuff. It should fit smoothly and snugly around your bare upper arm. There should be enough room to slip a fingertip under the cuff. The bottom edge of the cuff should be 1 inch above the crease of the elbow. . Ideally, take 3 measurements at one sitting and record the average.

## 2016-11-12 NOTE — Assessment & Plan Note (Signed)
Patient with CKD and hypertension, with improved pressures.  In the office her reading today was 146/80.  Will have her increase the valsartan to 320 mg from 160 mg at this time.  She is also to go to the lab for a BMET, as there was a slight bump in her creatinine after the last dose increase.  Will see her back in a month for follow up.

## 2016-11-13 ENCOUNTER — Encounter: Payer: Self-pay | Admitting: Pharmacist Clinician (PhC)/ Clinical Pharmacy Specialist

## 2016-11-22 ENCOUNTER — Ambulatory Visit: Payer: Self-pay | Admitting: Internal Medicine

## 2016-12-02 ENCOUNTER — Other Ambulatory Visit (INDEPENDENT_AMBULATORY_CARE_PROVIDER_SITE_OTHER): Payer: Commercial Managed Care - PPO

## 2016-12-02 DIAGNOSIS — R7989 Other specified abnormal findings of blood chemistry: Secondary | ICD-10-CM | POA: Diagnosis not present

## 2016-12-02 DIAGNOSIS — Z0000 Encounter for general adult medical examination without abnormal findings: Secondary | ICD-10-CM | POA: Diagnosis not present

## 2016-12-02 LAB — CBC WITH DIFFERENTIAL/PLATELET
BASOS ABS: 0.1 10*3/uL (ref 0.0–0.1)
Basophils Relative: 1.2 % (ref 0.0–3.0)
Eosinophils Absolute: 0.3 10*3/uL (ref 0.0–0.7)
Eosinophils Relative: 2.9 % (ref 0.0–5.0)
HEMATOCRIT: 42.6 % (ref 36.0–46.0)
Hemoglobin: 14.5 g/dL (ref 12.0–15.0)
LYMPHS PCT: 30.2 % (ref 12.0–46.0)
Lymphs Abs: 2.8 10*3/uL (ref 0.7–4.0)
MCHC: 34.1 g/dL (ref 30.0–36.0)
MCV: 86.5 fl (ref 78.0–100.0)
Monocytes Absolute: 0.6 10*3/uL (ref 0.1–1.0)
Monocytes Relative: 6.3 % (ref 3.0–12.0)
NEUTROS ABS: 5.4 10*3/uL (ref 1.4–7.7)
Neutrophils Relative %: 59.4 % (ref 43.0–77.0)
PLATELETS: 270 10*3/uL (ref 150.0–400.0)
RBC: 4.93 Mil/uL (ref 3.87–5.11)
RDW: 14.5 % (ref 11.5–15.5)
WBC: 9.1 10*3/uL (ref 4.0–10.5)

## 2016-12-02 LAB — BASIC METABOLIC PANEL
BUN: 13 mg/dL (ref 6–23)
CHLORIDE: 103 meq/L (ref 96–112)
CO2: 28 meq/L (ref 19–32)
CREATININE: 1.29 mg/dL — AB (ref 0.40–1.20)
Calcium: 8.8 mg/dL (ref 8.4–10.5)
GFR: 44.05 mL/min — ABNORMAL LOW (ref >60.00)
Glucose, Bld: 107 mg/dL — ABNORMAL HIGH (ref 70–99)
Potassium: 4.1 mEq/L (ref 3.5–5.1)
Sodium: 133 mEq/L — ABNORMAL LOW (ref 135–145)

## 2016-12-02 LAB — LIPID PANEL
CHOLESTEROL: 196 mg/dL (ref 0–200)
HDL: 43.2 mg/dL (ref >39.00)
NonHDL: 152.45
TRIGLYCERIDES: 271 mg/dL — AB (ref 0.0–149.0)
Total CHOL/HDL Ratio: 5
VLDL: 54.2 mg/dL — ABNORMAL HIGH (ref 0.0–40.0)

## 2016-12-02 LAB — URINALYSIS, ROUTINE W REFLEX MICROSCOPIC
Bilirubin Urine: NEGATIVE
KETONES UR: NEGATIVE
Leukocytes, UA: NEGATIVE
Nitrite: NEGATIVE
PH: 6 (ref 5.0–8.0)
TOTAL PROTEIN, URINE-UPE24: NEGATIVE
URINE GLUCOSE: NEGATIVE
UROBILINOGEN UA: 0.2 (ref 0.0–1.0)
WBC UA: NONE SEEN (ref 0–?)

## 2016-12-02 LAB — LDL CHOLESTEROL, DIRECT: LDL DIRECT: 103 mg/dL

## 2016-12-02 LAB — HEPATIC FUNCTION PANEL
ALT: 14 U/L (ref 0–35)
AST: 14 U/L (ref 0–37)
Albumin: 4 g/dL (ref 3.5–5.2)
Alkaline Phosphatase: 61 U/L (ref 39–117)
Bilirubin, Direct: 0 mg/dL (ref 0.0–0.3)
TOTAL PROTEIN: 7.4 g/dL (ref 6.0–8.3)
Total Bilirubin: 0.3 mg/dL (ref 0.2–1.2)

## 2016-12-02 LAB — TSH: TSH: 1.8 u[IU]/mL (ref 0.35–4.50)

## 2016-12-03 LAB — HEPATITIS C ANTIBODY: HCV AB: NEGATIVE

## 2016-12-10 ENCOUNTER — Ambulatory Visit: Payer: Commercial Managed Care - PPO | Admitting: Internal Medicine

## 2016-12-11 ENCOUNTER — Ambulatory Visit: Payer: Commercial Managed Care - PPO | Admitting: Internal Medicine

## 2016-12-17 ENCOUNTER — Ambulatory Visit (INDEPENDENT_AMBULATORY_CARE_PROVIDER_SITE_OTHER): Payer: Commercial Managed Care - PPO | Admitting: Pharmacist Clinician (PhC)/ Clinical Pharmacy Specialist

## 2016-12-17 DIAGNOSIS — I10 Essential (primary) hypertension: Secondary | ICD-10-CM | POA: Diagnosis not present

## 2016-12-17 MED ORDER — VALSARTAN 320 MG PO TABS: 320.0000 mg | ORAL_TABLET | Freq: Every day | ORAL | 2 refills | Status: DC

## 2016-12-17 MED ORDER — HYDROCHLOROTHIAZIDE 12.5 MG PO TABS: ORAL_TABLET | ORAL | 2 refills | Status: DC

## 2016-12-17 NOTE — Assessment & Plan Note (Signed)
BP much improved and at goal with increase in valsartan.  No changes to medication.  Will have patient continue to monitor home readings several days per week and she knows to call should her pressure increase consistently into the 140 systolic range.

## 2016-12-17 NOTE — Progress Notes (Signed)
`Patient ID: Cindy Reid                 DOB: 03/13/1951                      MRN: [id number]     HPI: Cindy Reid is a 66 y.o. female patient of Dr. Duke Salviaandolph with PMH below who presents today for hypertension follow up. At her last visit I increased the valsartan to 320 mg and continued the hctz at 6.25 mg. She has been feeling well and has no complaints about her medications today.   Her only complaint today involves her hands discoloring when they get cold.  From the pictures she showed me it looks like Raynaud's, she will see Dr. Jonny RuizJohn next week and discuss this.   Cardiac Hx: HTN, syncope, CKD, HLD  Current HTN meds:  Valsartan 160 mg daily HCTZ 6.25 mg daily.   Previously tried:  Amlodipine - gingival hyperplasia HCTZ - urinary frequency with dose > 6.25 mg  BP goal: <130/80  Family History: Pt is unsure if her parents had HTN but that it was likely. She reports her mother had DM. Neither of her sons have HTN as far as she is aware.   Social History: Pt reports that she smokes "too much" but will not disclose how much she is currently smoking. She has attempted to quit in the past and has been unsuccessful. No interest in quitting at this time. She denies smokeless tobacco and enodrses occasional alcoholic drink   Diet: Pt reports eating more of her meals at home. She uses salts and seasonings while cooking and rarely adds salt at the table. She admits to gaining weight in the past month, she is unsure if it is because he husband is now home (was working [place] [employer]), the holidays, or her increase in valsartan.    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  Now that one of her dogs has died, she admits to being more sedentary (she doesn't have to take him for a walk now)   Home BP readings:  Home meter tested at last OV and found to be accurate.  Since increasing the valsartan her average BP was 124/67 with a range of  102-148/56-77.   Only 4 of the 18 readings showed a systolic >130.     HR WNL.   Wt Readings from Last 3 Encounters:  07/23/16 174 lb 9.6 oz (79.2 kg)  12/07/15 172 lb (78 kg)  08/03/15 175 lb (79.4 kg)   BP Readings from Last 3 Encounters:  12/17/16 126/72  11/12/16 (!) 146/80  09/30/16 (!) 162/90   Pulse Readings from Last 3 Encounters:  12/17/16 88  11/12/16 80  09/30/16 88    Renal function: CrCl cannot be calculated (Unknown ideal weight.).  Past Medical History:  Diagnosis Date  . Epilepsy (HCC)   . Hyperlipidemia   . Hypertension   . Migraine     Current Outpatient Prescriptions on File Prior to Visit  Medication Sig Dispense Refill  . aspirin EC 81 MG tablet Take 1 tablet (81 mg total) by mouth daily. 90 tablet 11  . atorvastatin (LIPITOR) 20 MG tablet TAKE 1 TABLET DAILY 90 tablet 3   No current facility-administered medications on file prior to visit.     Allergies  Allergen Reactions  . Amlodipine     gingival hyperplasia  . Dilantin [Phenytoin] Other (See  Comments)    UNSURE OF REACTION    Blood pressure 126/72, pulse 88.   Assessment/Plan:  BP much improved and at goal with increase in valsartan.  No changes to medication.  Will have patient continue to monitor home readings several days per week and she knows to call should her pressure increase consistently into the 140 systolic range.   Thank you, Phillips Hay, PharmD CPP [facility]  12/17/2016 9:54 AM

## 2016-12-17 NOTE — Patient Instructions (Signed)
Your blood pressure today is 126/72  (goal is < 130/80)  Check your blood pressure at home several times each week and keep record of the readings.  Take your BP meds as follows:  Continue with valsartan 320 mg and hydrochlorothiazide 6.25 mg - once daily  Bring all of your meds, your BP cuff and your record of home blood pressures to your next appointment.  Exercise as you're able, try to walk approximately 30 minutes per day.  Keep salt intake to a minimum, especially watch canned and prepared boxed foods.  Eat more fresh fruits and vegetables and fewer canned items.  Avoid eating in fast food restaurants.    HOW TO TAKE YOUR BLOOD PRESSURE: . Rest 5 minutes before taking your blood pressure. .  Don't smoke or drink caffeinated beverages for at least 30 minutes before. . Take your blood pressure before (not after) you eat. . Sit comfortably with your back supported and both feet on the floor (don't cross your legs). . Elevate your arm to heart level on a table or a desk. . Use the proper sized cuff. It should fit smoothly and snugly around your bare upper arm. There should be enough room to slip a fingertip under the cuff. The bottom edge of the cuff should be 1 inch above the crease of the elbow. . Ideally, take 3 measurements at one sitting and record the average.

## 2016-12-25 ENCOUNTER — Encounter: Payer: Self-pay | Admitting: Internal Medicine

## 2016-12-25 ENCOUNTER — Ambulatory Visit (INDEPENDENT_AMBULATORY_CARE_PROVIDER_SITE_OTHER): Payer: Medicare Other | Admitting: Internal Medicine

## 2016-12-25 VITALS — BP 118/88 | HR 92 | Temp 97.6°F | Ht 63.0 in | Wt 172.0 lb

## 2016-12-25 DIAGNOSIS — M25551 Pain in right hip: Secondary | ICD-10-CM

## 2016-12-25 DIAGNOSIS — R209 Unspecified disturbances of skin sensation: Secondary | ICD-10-CM | POA: Diagnosis not present

## 2016-12-25 DIAGNOSIS — I10 Essential (primary) hypertension: Secondary | ICD-10-CM | POA: Diagnosis not present

## 2016-12-25 DIAGNOSIS — E785 Hyperlipidemia, unspecified: Secondary | ICD-10-CM | POA: Diagnosis not present

## 2016-12-25 DIAGNOSIS — R739 Hyperglycemia, unspecified: Secondary | ICD-10-CM

## 2016-12-25 DIAGNOSIS — I7300 Raynaud's syndrome without gangrene: Secondary | ICD-10-CM | POA: Diagnosis not present

## 2016-12-25 DIAGNOSIS — N183 Chronic kidney disease, stage 3 unspecified: Secondary | ICD-10-CM

## 2016-12-25 DIAGNOSIS — Z0001 Encounter for general adult medical examination with abnormal findings: Secondary | ICD-10-CM

## 2016-12-25 DIAGNOSIS — IMO0001 Reserved for inherently not codable concepts without codable children: Secondary | ICD-10-CM

## 2016-12-25 DIAGNOSIS — R1031 Right lower quadrant pain: Secondary | ICD-10-CM | POA: Insufficient documentation

## 2016-12-25 NOTE — Assessment & Plan Note (Signed)
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

## 2016-12-25 NOTE — Assessment & Plan Note (Signed)
stable overall by history and exam, recent data reviewed with pt, and pt to continue medical treatment as before,  to f/u any worsening symptoms or concerns Lab Results  Component Value Date   HGBA1C 5.8 06/01/2015

## 2016-12-25 NOTE — Assessment & Plan Note (Addendum)
Seems likely worse in the past yr assoc with ARB dose increase, elevated minor, will cont diovan for now, f/u lab in 6 mo  In addition to the time spent performing CPE, I spent an additional 25 minutes face to face,in which greater than 50% of this time was spent in counseling and coordination of care for patient's acute illness as documented.

## 2016-12-25 NOTE — Assessment & Plan Note (Signed)
Recent new onset, d/w pt, no assoc other rheum symptoms, will monitor and follow for now, exam benign, pt reassured

## 2016-12-25 NOTE — Assessment & Plan Note (Signed)

## 2016-12-25 NOTE — Progress Notes (Signed)
Subjective:    Patient ID: Cindy Reid, female    DOB: 1951-05-05, 66 y.o.   MRN: [id number]  HPI  Here for wellness and f/u;  Overall doing ok;  Pt denies Chest pain, worsening SOB, DOE, wheezing, orthopnea, PND, worsening LE edema, palpitations, dizziness or syncope.  Pt denies neurological change such as new headache, facial or extremity weakness.  Pt denies polydipsia, polyuria, or low sugar symptoms. Pt states overall good compliance with treatment and medications, good tolerability, and has been trying to follow appropriate diet.  Pt denies worsening depressive symptoms, suicidal ideation or panic. No fever, night sweats, wt loss, loss of appetite, or other constitutional symptoms.  Pt states good ability with ADL's, has low fall risk, home safety reviewed and adequate, no other significant changes in hearing or vision, and only occasionally active with exercise.  No other change in basic hx Wt Readings from Last 3 Encounters:  12/25/16 172 lb (78 kg)  07/23/16 174 lb 9.6 oz (79.2 kg)  12/07/15 172 lb (78 kg)   BP Readings from Last 3 Encounters:  12/25/16 118/88  12/17/16 126/72  11/12/16 (!) 146/80  Declines immunizations, pap smear, and mammogram   Also with c/o right inguinal pain, sharp, mild, intermittent radiates to the right post buttock like it seems to go straight through, only occurs in moving the right hip joint in an external or internal rotation movement, though still has FROM.  Better to not do this.  Ongoing for > 1 mo, no prior hx of this, no prior films, no giveaways or falls or other trauma.  Also c/o fingers and toes changing color to white then blue then red again several times in the recent months, no prior hx of this, no assoc known rheumatologic disorder.  Last episode earlier today.  No pain, but also has had intermittent mild numbness and tingling to first 3 fingers of each hand, without color change assoc with this. Past Medical History:  Diagnosis Date  .  Epilepsy (HCC)   . Hyperlipidemia   . Hypertension   . Migraine    Past Surgical History:  Procedure Laterality Date  . COLONOSCOPY    . MOUTH SURGERY    . TUBAL LIGATION      reports that she has been smoking Cigarettes.  She has been smoking about 2.00 packs per day. She has never used smokeless tobacco. She reports that she drinks alcohol. She reports that she does not use drugs. family history includes Cancer in her father and mother; Colon cancer in her maternal aunt; Diabetes in her mother; Heart disease in her father. Allergies  Allergen Reactions  . Amlodipine     gingival hyperplasia  . Dilantin [Phenytoin] Other (See Comments)    UNSURE OF REACTION   Current Outpatient Prescriptions on File Prior to Visit  Medication Sig Dispense Refill  . aspirin EC 81 MG tablet Take 1 tablet (81 mg total) by mouth daily. 90 tablet 11  . atorvastatin (LIPITOR) 20 MG tablet TAKE 1 TABLET DAILY 90 tablet 3  . hydrochlorothiazide (HYDRODIURIL) 12.5 MG tablet 1/2 tablet by mouth daily 45 tablet 2  . valsartan (DIOVAN) 320 MG tablet Take 1 tablet (320 mg total) by mouth daily. 90 tablet 2   No current facility-administered medications on file prior to visit.     Review of Systems Constitutional: Negative for increased diaphoresis, or other activity, appetite or siginficant weight change other than noted HENT: Negative for worsening hearing loss, ear pain, facial  swelling, mouth sores and neck stiffness.   Eyes: Negative for other worsening pain, redness or visual disturbance.  Respiratory: Negative for choking or stridor Cardiovascular: Negative for other chest pain and palpitations.  Gastrointestinal: Negative for worsening diarrhea, blood in stool, or abdominal distention Genitourinary: Negative for hematuria, flank pain or change in urine volume.  Musculoskeletal: Negative for myalgias or other joint complaints.  Skin: Negative for other color change and wound or drainage.    Neurological: Negative for syncope and numbness. other than noted Hematological: Negative for adenopathy. or other swelling Psychiatric/Behavioral: Negative for hallucinations, SI, self-injury, decreased concentration or other worsening agitation.  All other system neg per pt    Objective:   Physical Exam BP 118/88   Pulse 92   Temp 97.6 F (36.4 C)   Ht  (1.6 m)   Wt 172 lb (78 kg)   SpO2 100%   BMI 30.47 kg/m  VS noted,  Constitutional: Pt is oriented to person, place, and time. Appears well-developed and well-nourished, in no significant distress Head: Normocephalic and atraumatic  Eyes: Conjunctivae and EOM are normal. Pupils are equal, round, and reactive to light Right Ear: External ear normal.  Left Ear: External ear normal Nose: Nose normal.  Mouth/Throat: Oropharynx is clear and moist  Neck: Normal range of motion. Neck supple. No JVD present. No tracheal deviation present or significant neck LA or mass Cardiovascular: Normal rate, regular rhythm, normal heart sounds and intact distal pulses.   Pulmonary/Chest: Effort normal and breath sounds without rales or wheezing  Abdominal: Soft. Bowel sounds are normal. NT. No HSM  Musculoskeletal: Normal range of motion. Exhibits no edema Lymphadenopathy: Has no cervical adenopathy.  Neurological: Pt is alert and oriented to person, place, and time. Pt has normal reflexes. No cranial nerve deficit. Motor grossly intact Skin: Skin is warm and dry. No rash noted or new ulcers Psychiatric:  Has normal mood and affect. Behavior is normal.  Right hip passive motion with elicited mild pain on internal and ext rotations No other new exam findings    Assessment & Plan:

## 2016-12-25 NOTE — Assessment & Plan Note (Signed)
stable overall by history and exam, recent data reviewed with pt, and pt to continue medical treatment as before,  to f/u any worsening symptoms or concerns BP Readings from Last 3 Encounters:  12/25/16 118/88  12/17/16 126/72  11/12/16 (!) 146/80

## 2016-12-25 NOTE — Patient Instructions (Signed)
Please continue all other medications as before, and refills have been done if requested.  Please have the pharmacy call with any other refills you may need.  Please continue your efforts at being more active, low cholesterol diet, and weight control.  You are otherwise up to date with prevention measures today.  Please keep your appointments with your specialists as you may have planned  Please return to the LAB ONLY in 6 MONTHS for followup blood work  You will be contacted by phone if any changes need to be made immediately.  Otherwise, you will receive a letter about your results with an explanation, but please check with MyChart first.  Please remember to sign up for MyChart if you have not done so, as this will be important to you in the future with finding out test results, communicating by private email, and scheduling acute appointments online when needed.  Please return in 1 year for your yearly visit, or sooner if needed, with Lab testing done 3-5 days before

## 2016-12-25 NOTE — Assessment & Plan Note (Signed)
stable overall by history and exam, recent data reviewed with pt, and pt to continue medical treatment as before,  to f/u any worsening symptoms or concerns Lab Results  Component Value Date   LDLCALC 107 (H) 12/01/2015

## 2016-12-25 NOTE — Assessment & Plan Note (Signed)
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

## 2017-05-12 ENCOUNTER — Encounter: Payer: Self-pay | Admitting: Cardiovascular Disease

## 2017-05-21 MED ORDER — IRBESARTAN 300 MG PO TABS: 300.0000 mg | ORAL_TABLET | Freq: Every day | ORAL | 1 refills | Status: DC

## 2017-05-26 ENCOUNTER — Other Ambulatory Visit (INDEPENDENT_AMBULATORY_CARE_PROVIDER_SITE_OTHER): Payer: Commercial Managed Care - PPO

## 2017-05-26 DIAGNOSIS — E785 Hyperlipidemia, unspecified: Secondary | ICD-10-CM | POA: Diagnosis not present

## 2017-05-26 DIAGNOSIS — N183 Chronic kidney disease, stage 3 unspecified: Secondary | ICD-10-CM

## 2017-05-26 DIAGNOSIS — R739 Hyperglycemia, unspecified: Secondary | ICD-10-CM | POA: Diagnosis not present

## 2017-05-26 LAB — BASIC METABOLIC PANEL
BUN: 15 mg/dL (ref 6–23)
CALCIUM: 8.6 mg/dL (ref 8.4–10.5)
CO2: 27 mEq/L (ref 19–32)
Chloride: 95 mEq/L — ABNORMAL LOW (ref 96–112)
Creatinine, Ser: 1.34 mg/dL — ABNORMAL HIGH (ref 0.40–1.20)
GFR: 42.09 mL/min — AB (ref >60.00)
GLUCOSE: 134 mg/dL — AB (ref 70–99)
Potassium: 3.8 mEq/L (ref 3.5–5.1)
SODIUM: 127 meq/L — AB (ref 135–145)

## 2017-05-26 LAB — LIPID PANEL
CHOLESTEROL: 183 mg/dL (ref 0–200)
HDL: 44.4 mg/dL (ref >39.00)
NONHDL: 138.2
TRIGLYCERIDES: 215 mg/dL — AB (ref 0.0–149.0)
Total CHOL/HDL Ratio: 4
VLDL: 43 mg/dL — ABNORMAL HIGH (ref 0.0–40.0)

## 2017-05-26 LAB — LDL CHOLESTEROL, DIRECT: Direct LDL: 106 mg/dL

## 2017-05-26 LAB — HEMOGLOBIN A1C: Hgb A1c MFr Bld: 6.2 % (ref 4.6–6.5)

## 2017-07-28 ENCOUNTER — Encounter: Payer: Self-pay | Admitting: Cardiovascular Disease

## 2017-07-28 ENCOUNTER — Ambulatory Visit (HOSPITAL_COMMUNITY)
Admission: RE | Admit: 2017-07-28 | Discharge: 2017-07-28 | Disposition: A | Discharge status: Home / Self Care | Payer: Commercial Managed Care - PPO | Source: Ambulatory Visit | Attending: Cardiology | Admitting: Cardiology

## 2017-07-28 ENCOUNTER — Ambulatory Visit (INDEPENDENT_AMBULATORY_CARE_PROVIDER_SITE_OTHER): Payer: Commercial Managed Care - PPO | Admitting: Cardiovascular Disease

## 2017-07-28 ENCOUNTER — Other Ambulatory Visit: Payer: Self-pay | Admitting: Internal Medicine

## 2017-07-28 VITALS — BP 118/62 | HR 103 | Ht 62.5 in | Wt 172.8 lb

## 2017-07-28 DIAGNOSIS — Z720 Tobacco use: Secondary | ICD-10-CM | POA: Diagnosis not present

## 2017-07-28 DIAGNOSIS — I6523 Occlusion and stenosis of bilateral carotid arteries: Secondary | ICD-10-CM

## 2017-07-28 DIAGNOSIS — I10 Essential (primary) hypertension: Secondary | ICD-10-CM | POA: Diagnosis not present

## 2017-07-28 DIAGNOSIS — I6529 Occlusion and stenosis of unspecified carotid artery: Secondary | ICD-10-CM

## 2017-07-28 DIAGNOSIS — E785 Hyperlipidemia, unspecified: Secondary | ICD-10-CM

## 2017-07-28 HISTORY — DX: Occlusion and stenosis of unspecified carotid artery: I65.29

## 2017-07-28 NOTE — Progress Notes (Signed)
Cardiology Office Note   Date:  07/28/2017   ID:  Cindy Reid, DOB July 29, 1951, MRN [id number]  PCP:  Corwin Levins, MD  Cardiologist:   Chilton Si, MD   Chief Complaint  Patient presents with  . Follow-up    1 year;  . Leg Pain    cramping in legs and toes    History of Present Illness: Cindy Reid is a 66 y.o. female with hypertension, hyperlipidemia, mild carotid stenosis, and palpitations who presents for follow up.  On 05/20/15 Cindy Reid was evaluated in the ED for a syncopal episode.  She was in the bathroom at [facility] with an upset stomach and diarrhea.  After using the bathroom and washing her hands, she was on her way out and the next thing she remembers she was on the floor.  There was no preceding chest pain, shortness of breath, palpitations, or warning. She has never had an episode like this in the past. She is brought to the ED for evaluation and was thought to have had an orthostatic hypotension episode.  Cindy Reid followed up with her primary care doctor who ordered an echocardiogram that was unremarkable aside from grade 1 diastolic dysfunction. A 48 hour Holter 05/2015 revealed sinus rhythm with rare PACs and PVCs.  She also had rare, short-lived, atrial runs that lasted for less than 2 seconds each.  She had a carotid ultrasound that showed mild bilateral ICA stenosis but no signficant obstruction.    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 She was switched to lisinopril/hydrochlorothiazide. She was seen in pharmacy clinic in her blood pressure was 174/106. She was started on valsartan which was increased to 320 mg.  This was switched to irbesartan due to a national recall on valsartan.  She reports she has been feeling well. She checks her blood pressures at home and they have been in the 110s systolic. She continues to smoke "too much" and is evasive about exactly how much she is smoking. Part of her wants to quit  smoking, but she also recognizes that she is not yet ready. Whenever she thinks about it she gets anxious and smokes even more. She has not been exercising regularly. She denies any chest pain or shortness of breath. She also denies lower extremity edema, orthopnea, or PND.   Past Medical History:  Diagnosis Date  . Carotid stenosis 07/28/2017   1-39% bilaterally 05/2015.  Marland Kitchen Epilepsy (HCC)   . Hyperlipidemia   . Hypertension   . Migraine     Past Surgical History:  Procedure Laterality Date  . COLONOSCOPY    . MOUTH SURGERY    . TUBAL LIGATION       Current Outpatient Prescriptions  Medication Sig Dispense Refill  . aspirin EC 81 MG tablet Take 1 tablet (81 mg total) by mouth daily. 90 tablet 11  . atorvastatin (LIPITOR) 20 MG tablet TAKE 1 TABLET DAILY 90 tablet 3  . hydrochlorothiazide (HYDRODIURIL) 12.5 MG tablet 1/2 tablet by mouth daily 45 tablet 2  . irbesartan (AVAPRO) 300 MG tablet Take 1 tablet (300 mg total) by mouth daily. 90 tablet 1   No current facility-administered medications for this visit.     Allergies:   Amlodipine and Dilantin [phenytoin]    Social History:  The patient  reports that she has been smoking Cigarettes.  She has been smoking about 2.00 packs per day. She has never used smokeless tobacco. She reports that she  drinks alcohol. She reports that she does not use drugs.   Family History:  [name] family history includes Cancer in her father and mother; Colon cancer in her maternal aunt; Diabetes in her mother; Heart disease in her father.    ROS:  Please see the history of present illness.  Otherwise, review of systems are positive for none.   All other systems are reviewed and negative.    PHYSICAL EXAM: VS:  BP 118/62   Pulse (!) 103   Ht 5' 2.5" (1.588 m)   Wt 78.4 kg (172 lb 12.8 oz)   BMI 31.10 kg/m  , BMI Body mass index is 31.1 kg/m. GENERAL:  Well appearing HEENT: Pupils equal round and reactive, fundi not visualized, oral  mucosa unremarkable NECK:  No jugular venous distention, waveform within normal limits, carotid upstroke brisk and symmetric, R carotid bruits, no thyromegaly LUNGS:  Clear to auscultation bilaterally HEART:  RRR.  PMI not displaced or sustained,S1 and S2 within normal limits, no S3, no S4, no clicks, no rubs, no murmurs ABD:  Flat, positive bowel sounds normal in frequency in pitch, no bruits, no rebound, no guarding, no midline pulsatile mass, no hepatomegaly, no splenomegaly EXT:  2 plus pulses throughout, no edema, no cyanosis no clubbing SKIN:  No rashes no nodules NEURO:  Cranial nerves II through XII grossly intact, motor grossly intact throughout PSYCH:  Cognitively intact, oriented to person place and time  EKG:  EKG is ordered today. The ekg ordered today demonstrates sinus tachycardia at 104 bpm.   07/28/17: sinus tachycardia. Rate 103 bpm. Cannot rule out prior inferior infarct.  Carotid ultrasound 06/22/15: 1-39% bilateral ICA stenosis  TTE 06/20/15: LVEF 55-60%.  Moderate LVH.  Trivial AR.     Recent Labs: 12/02/2016: ALT 14; Hemoglobin 14.5; Platelets 270.0; TSH 1.80 05/26/2017: BUN 15; Creatinine, Ser 1.34; Potassium 3.8; Sodium 127    Lipid Panel    Component Value Date/Time   CHOL 183 05/26/2017 1325   TRIG 215.0 (H) 05/26/2017 1325   HDL 44.40 05/26/2017 1325   CHOLHDL 4 05/26/2017 1325   VLDL 43.0 (H) 05/26/2017 1325   LDLCALC 107 (H) 12/01/2015 0847   LDLDIRECT 106.0 05/26/2017 1325      Wt Readings from Last 3 Encounters:  07/28/17 78.4 kg (172 lb 12.8 oz)  12/25/16 78 kg (172 lb)  07/23/16 79.2 kg (174 lb 9.6 oz)      Other studies Reviewed: Additional studies/ records that were reviewed today include: medical record . Review of the above records demonstrates:  Please see elsewhere in the note.     ASSESSMENT AND PLAN:  # Hypertension:  Blood pressure was initially elevated but was much better on repeat. Her blood pressure has been well-controlled  at home.  Continue HCTZ and irbesartan.    # Hyperlipidemia: Continue atoarvastatin.  LDL 106 04/2017.   # Tobacco abuse: Ms. Cindy Reid is not interested in smoking cessation at this time. We discussed Welbutrin as a possible agent given that she has a lot of anxiety about quitting.  SHe still isn't ready.  We discussed smokign cessation for 5 minutes.    # Mild carotid stenosis: Repeat Dopplers today.  1-39% stenosis 05/2015.  Continue aspirin and atorvastaitn.    Current medicines are reviewed at length with the patient today.  The patient does not have concerns regarding medicines.  The following changes have been made:  None  Labs/ tests ordered today include:   No orders of the defined  types were placed in this encounter.    Disposition:   FU with Dr. Elmarie Shiley C. Duke Salvia in 1 year.   Signed, Chilton Si, MD  07/28/2017 9:37 AM    [facility]

## 2017-07-28 NOTE — Patient Instructions (Signed)

## 2017-08-04 ENCOUNTER — Other Ambulatory Visit: Payer: Self-pay | Admitting: Internal Medicine

## 2017-08-21 ENCOUNTER — Other Ambulatory Visit: Payer: Self-pay | Admitting: Cardiovascular Disease

## 2017-08-21 NOTE — Telephone Encounter (Signed)
Please review for refill, Thanks !  

## 2017-08-26 ENCOUNTER — Other Ambulatory Visit: Payer: Self-pay | Admitting: *Deleted

## 2017-08-26 ENCOUNTER — Encounter: Payer: Self-pay | Admitting: Cardiovascular Disease

## 2017-08-26 MED ORDER — IRBESARTAN 300 MG PO TABS: 300.0000 mg | ORAL_TABLET | Freq: Every day | ORAL | 3 refills | Status: DC

## 2017-08-26 MED ORDER — ATORVASTATIN CALCIUM 20 MG PO TABS: 20.0000 mg | ORAL_TABLET | Freq: Every day | ORAL | 3 refills | Status: DC

## 2017-08-26 MED ORDER — HYDROCHLOROTHIAZIDE 12.5 MG PO TABS: ORAL_TABLET | ORAL | 3 refills | Status: DC

## 2017-12-22 ENCOUNTER — Other Ambulatory Visit (INDEPENDENT_AMBULATORY_CARE_PROVIDER_SITE_OTHER): Payer: Commercial Managed Care - PPO

## 2017-12-22 DIAGNOSIS — Z0001 Encounter for general adult medical examination with abnormal findings: Secondary | ICD-10-CM | POA: Diagnosis not present

## 2017-12-22 DIAGNOSIS — R739 Hyperglycemia, unspecified: Secondary | ICD-10-CM

## 2017-12-22 LAB — LIPID PANEL
CHOL/HDL RATIO: 5
CHOLESTEROL: 186 mg/dL (ref 0–200)
HDL: 41.3 mg/dL (ref >39.00)
NonHDL: 144.82
TRIGLYCERIDES: 252 mg/dL — AB (ref 0.0–149.0)
VLDL: 50.4 mg/dL — AB (ref 0.0–40.0)

## 2017-12-22 LAB — BASIC METABOLIC PANEL
BUN: 12 mg/dL (ref 6–23)
CHLORIDE: 99 meq/L (ref 96–112)
CO2: 27 meq/L (ref 19–32)
Calcium: 9.2 mg/dL (ref 8.4–10.5)
Creatinine, Ser: 1.18 mg/dL (ref 0.40–1.20)
GFR: 48.66 mL/min — ABNORMAL LOW (ref >60.00)
Glucose, Bld: 91 mg/dL (ref 70–99)
POTASSIUM: 3.7 meq/L (ref 3.5–5.1)
Sodium: 135 mEq/L (ref 135–145)

## 2017-12-22 LAB — URINALYSIS, ROUTINE W REFLEX MICROSCOPIC
Bilirubin Urine: NEGATIVE
Ketones, ur: NEGATIVE
LEUKOCYTES UA: NEGATIVE
NITRITE: NEGATIVE
PH: 7 (ref 5.0–8.0)
Specific Gravity, Urine: 1.01 (ref 1.000–1.030)
TOTAL PROTEIN, URINE-UPE24: NEGATIVE
URINE GLUCOSE: NEGATIVE
UROBILINOGEN UA: 0.2 (ref 0.0–1.0)

## 2017-12-22 LAB — HEPATIC FUNCTION PANEL
ALBUMIN: 3.7 g/dL (ref 3.5–5.2)
ALT: 10 U/L (ref 0–35)
AST: 12 U/L (ref 0–37)
Alkaline Phosphatase: 60 U/L (ref 39–117)
Bilirubin, Direct: 0 mg/dL (ref 0.0–0.3)
Total Bilirubin: 0.4 mg/dL (ref 0.2–1.2)
Total Protein: 7.2 g/dL (ref 6.0–8.3)

## 2017-12-22 LAB — CBC WITH DIFFERENTIAL/PLATELET
BASOS PCT: 1.4 % (ref 0.0–3.0)
Basophils Absolute: 0.1 10*3/uL (ref 0.0–0.1)
EOS ABS: 0.2 10*3/uL (ref 0.0–0.7)
Eosinophils Relative: 2.2 % (ref 0.0–5.0)
HEMATOCRIT: 41 % (ref 36.0–46.0)
Hemoglobin: 13.8 g/dL (ref 12.0–15.0)
LYMPHS ABS: 2.8 10*3/uL (ref 0.7–4.0)
LYMPHS PCT: 32.9 % (ref 12.0–46.0)
MCHC: 33.7 g/dL (ref 30.0–36.0)
MCV: 85.3 fl (ref 78.0–100.0)
Monocytes Absolute: 0.7 10*3/uL (ref 0.1–1.0)
Monocytes Relative: 8.6 % (ref 3.0–12.0)
NEUTROS ABS: 4.7 10*3/uL (ref 1.4–7.7)
NEUTROS PCT: 54.9 % (ref 43.0–77.0)
PLATELETS: 318 10*3/uL (ref 150.0–400.0)
RBC: 4.81 Mil/uL (ref 3.87–5.11)
RDW: 14.3 % (ref 11.5–15.5)
WBC: 8.6 10*3/uL (ref 4.0–10.5)

## 2017-12-22 LAB — LDL CHOLESTEROL, DIRECT: Direct LDL: 102 mg/dL

## 2017-12-22 LAB — TSH: TSH: 2.49 u[IU]/mL (ref 0.35–4.50)

## 2017-12-22 LAB — HEMOGLOBIN A1C: Hgb A1c MFr Bld: 6.3 % (ref 4.6–6.5)

## 2017-12-23 ENCOUNTER — Encounter: Payer: Self-pay | Admitting: Cardiovascular Disease

## 2017-12-23 ENCOUNTER — Telehealth: Payer: Self-pay | Admitting: *Deleted

## 2017-12-23 DIAGNOSIS — Z5181 Encounter for therapeutic drug level monitoring: Secondary | ICD-10-CM

## 2017-12-23 NOTE — Telephone Encounter (Signed)
Will forward to Pharm D and Dr Duke Salviaandolph for review review     Message from Cindy HumbleWeitzel, Cindy Reid to Cindy Reid, Tiffany, MD sent at 12/23/2017 2:02 PM -----   [facility] has called to say the medicine Irbesartan 300mg  is on back order and will not be available until at least the end of March. I have 3 days of medicine left. They are going to call to see if you have another medicine that can replace this med until the supplier can send it out.  [facility] [address] . Please advise.    Cindy AsalGloria Reid  [email or web address]  [phone] Cell  [phone] Home

## 2017-12-24 MED ORDER — LISINOPRIL 40 MG PO TABS: 40.0000 mg | ORAL_TABLET | Freq: Every day | ORAL | 3 refills | Status: DC

## 2017-12-24 NOTE — Telephone Encounter (Signed)
Advised patient, verbalized understanding  

## 2017-12-24 NOTE — Telephone Encounter (Signed)
Lisinopril 40mg  daily will be the equivalent dose for conversion.  (Noted patient allergic to amlodipine).  Recommendation:  1. Monitor BP 3x/week for 2 weeks  2. Repeat BMET 2 weeks after initiating lisinopril

## 2017-12-30 ENCOUNTER — Other Ambulatory Visit: Payer: Commercial Managed Care - PPO

## 2017-12-30 ENCOUNTER — Ambulatory Visit (INDEPENDENT_AMBULATORY_CARE_PROVIDER_SITE_OTHER)
Admission: RE | Admit: 2017-12-30 | Discharge: 2017-12-30 | Disposition: A | Discharge status: Home / Self Care | Payer: Commercial Managed Care - PPO | Source: Ambulatory Visit | Attending: Internal Medicine | Admitting: Internal Medicine

## 2017-12-30 ENCOUNTER — Encounter: Payer: Self-pay | Admitting: Internal Medicine

## 2017-12-30 ENCOUNTER — Ambulatory Visit (INDEPENDENT_AMBULATORY_CARE_PROVIDER_SITE_OTHER): Payer: Commercial Managed Care - PPO | Admitting: Internal Medicine

## 2017-12-30 VITALS — BP 136/84 | HR 94 | Temp 98.0°F | Ht 62.5 in | Wt 170.0 lb

## 2017-12-30 DIAGNOSIS — E785 Hyperlipidemia, unspecified: Secondary | ICD-10-CM | POA: Diagnosis not present

## 2017-12-30 DIAGNOSIS — Z0000 Encounter for general adult medical examination without abnormal findings: Secondary | ICD-10-CM

## 2017-12-30 DIAGNOSIS — R739 Hyperglycemia, unspecified: Secondary | ICD-10-CM | POA: Diagnosis not present

## 2017-12-30 DIAGNOSIS — N183 Chronic kidney disease, stage 3 unspecified: Secondary | ICD-10-CM

## 2017-12-30 DIAGNOSIS — I10 Essential (primary) hypertension: Secondary | ICD-10-CM | POA: Diagnosis not present

## 2017-12-30 DIAGNOSIS — E2839 Other primary ovarian failure: Secondary | ICD-10-CM

## 2017-12-30 MED ORDER — ATORVASTATIN CALCIUM 20 MG PO TABS: 20.0000 mg | ORAL_TABLET | Freq: Every day | ORAL | 3 refills | Status: DC

## 2017-12-30 MED ORDER — HYDROCHLOROTHIAZIDE 12.5 MG PO TABS: ORAL_TABLET | ORAL | 3 refills | Status: DC

## 2017-12-30 MED ORDER — LISINOPRIL 40 MG PO TABS: 40.0000 mg | ORAL_TABLET | Freq: Every day | ORAL | 3 refills | Status: DC

## 2017-12-30 NOTE — Patient Instructions (Addendum)
Please schedule the bone density test before leaving today at the scheduling desk (where you check out)  Please continue all other medications as before, and refills have been done if requested.  Please have the pharmacy call with any other refills you may need.  Please continue your efforts at being more active, low cholesterol diet, and weight control.  You are otherwise up to date with prevention measures today.  Please keep your appointments with your specialists as you may have planned  Please go to the LAB in the Basement (turn left off the elevator) for the tests to be done today  You will be contacted by phone if any changes need to be made immediately.  Otherwise, you will receive a letter about your results with an explanation, but please check with MyChart first.  Please remember to sign up for MyChart if you have not done so, as this will be important to you in the future with finding out test results, communicating by private email, and scheduling acute appointments online when needed.  Please return in 6 months, or sooner if needed, with Lab testing done 3-5 days before  

## 2017-12-30 NOTE — Progress Notes (Signed)
Subjective:    Patient ID: Cindy Reid, female    DOB: Jul 05, 1951, 67 y.o.   MRN: [id number]  HPI  Here for wellness and f/u;  Overall doing ok;  Pt denies Chest pain, worsening SOB, DOE, wheezing, orthopnea, PND, worsening LE edema, palpitations, dizziness or syncope.  Pt denies neurological change such as new headache, facial or extremity weakness.  Pt denies polydipsia, polyuria, or low sugar symptoms. Pt states overall good compliance with treatment and medications, good tolerability, and has been trying to follow appropriate diet.  Pt denies worsening depressive symptoms, suicidal ideation or panic. No fever, night sweats, wt loss, loss of appetite, or other constitutional symptoms.  Pt states good ability with ADL's, has low fall risk, home safety reviewed and adequate, no other significant changes in hearing or vision, and only occasionally active with exercise.  Declines mammogram and pneumonia shot. No other new complaint or interval change Past Medical History:  Diagnosis Date  . Carotid stenosis 07/28/2017   1-39% bilaterally 05/2015.  Marland Kitchen Epilepsy (HCC)   . Hyperlipidemia   . Hypertension   . Migraine    Past Surgical History:  Procedure Laterality Date  . COLONOSCOPY    . MOUTH SURGERY    . TUBAL LIGATION      reports that she has been smoking cigarettes.  She has been smoking about 2.00 packs per day. she has never used smokeless tobacco. She reports that she drinks alcohol. She reports that she does not use drugs. family history includes Cancer in her father and mother; Colon cancer in her maternal aunt; Diabetes in her mother; Heart disease in her father. Allergies  Allergen Reactions  . Amlodipine     gingival hyperplasia  . Dilantin [Phenytoin] Other (See Comments)    UNSURE OF REACTION   Current Outpatient Medications on File Prior to Visit  Medication Sig Dispense Refill  . aspirin EC 81 MG tablet Take 1 tablet (81 mg total) by mouth daily. 90 tablet 11   No  current facility-administered medications on file prior to visit.    Review of Systems Constitutional: Negative for other unusual diaphoresis, sweats, appetite or weight changes HENT: Negative for other worsening hearing loss, ear pain, facial swelling, mouth sores or neck stiffness.   Eyes: Negative for other worsening pain, redness or other visual disturbance.  Respiratory: Negative for other stridor or swelling Cardiovascular: Negative for other palpitations or other chest pain  Gastrointestinal: Negative for worsening diarrhea or loose stools, blood in stool, distention or other pain Genitourinary: Negative for hematuria, flank pain or other change in urine volume.  Musculoskeletal: Negative for myalgias or other joint swelling.  Skin: Negative for other color change, or other wound or worsening drainage.  Neurological: Negative for other syncope or numbness. Hematological: Negative for other adenopathy or swelling Psychiatric/Behavioral: Negative for hallucinations, other worsening agitation, SI, self-injury, or new decreased concentration All other system neg per pt    Objective:   Physical Exam BP 136/84   Pulse 94   Temp 98 F (36.7 C) (Oral)   Ht 5' 2.5" (1.588 m)   Wt 170 lb (77.1 kg)   SpO2 97%   BMI 30.60 kg/m  VS noted,  Constitutional: Pt appears in NAD HENT: Head: NCAT.  Right Ear: External ear normal.  Left Ear: External ear normal.  Eyes: . Pupils are equal, round, and reactive to light. Conjunctivae and EOM are normal Nose: without d/c or deformity Neck: Neck supple. Gross normal ROM Cardiovascular: Normal rate  and regular rhythm.   Pulmonary/Chest: Effort normal and breath sounds without rales or wheezing.  Abd:  Soft, NT, ND, + BS, no organomegaly Neurological: Pt is alert. At baseline orientation, motor grossly intact Skin: Skin is warm. No rashes, other new lesions, no LE edema Psychiatric: Pt behavior is normal without agitation  No other exam  findings    Assessment & Plan:

## 2018-01-02 NOTE — Assessment & Plan Note (Signed)
stable overall by history and exam, recent data reviewed with pt, and pt to continue medical treatment as before,  to f/u any worsening symptoms or concerns  

## 2018-01-02 NOTE — Assessment & Plan Note (Signed)

## 2018-01-02 NOTE — Assessment & Plan Note (Signed)
Lab Results  Component Value Date   HGBA1C 6.3 12/22/2017  stable overall by history and exam, recent data reviewed with pt, and pt to continue medical treatment as before,  to f/u any worsening symptoms or concerns

## 2018-01-02 NOTE — Assessment & Plan Note (Signed)
stable overall by history and exam, recent data reviewed with pt, and pt to continue medical treatment as before,  to f/u any worsening symptoms or concerns, declines renal referral

## 2018-01-02 NOTE — Assessment & Plan Note (Signed)
stable overall by history and exam, recent data reviewed with pt, and pt to continue medical treatment as before,  to f/u any worsening symptoms or concerns, for better diet, declines increased tx

## 2018-01-12 LAB — BASIC METABOLIC PANEL
BUN / CREAT RATIO: 12 (ref 12–28)
BUN: 14 mg/dL (ref 8–27)
CHLORIDE: 95 mmol/L — AB (ref 96–106)
CO2: 22 mmol/L (ref 20–29)
Calcium: 8.8 mg/dL (ref 8.7–10.3)
Creatinine, Ser: 1.15 mg/dL — ABNORMAL HIGH (ref 0.57–1.00)
GFR calc non Af Amer: 50 mL/min/{1.73_m2} — ABNORMAL LOW (ref >59)
GFR, EST AFRICAN AMERICAN: 57 mL/min/{1.73_m2} — AB (ref >59)
Glucose: 126 mg/dL — ABNORMAL HIGH (ref 65–99)
POTASSIUM: 4.1 mmol/L (ref 3.5–5.2)
SODIUM: 132 mmol/L — AB (ref 134–144)

## 2018-06-22 ENCOUNTER — Other Ambulatory Visit (INDEPENDENT_AMBULATORY_CARE_PROVIDER_SITE_OTHER): Payer: Commercial Managed Care - PPO

## 2018-06-22 DIAGNOSIS — N183 Chronic kidney disease, stage 3 unspecified: Secondary | ICD-10-CM

## 2018-06-22 DIAGNOSIS — R739 Hyperglycemia, unspecified: Secondary | ICD-10-CM | POA: Diagnosis not present

## 2018-06-22 LAB — LIPID PANEL
CHOL/HDL RATIO: 4
Cholesterol: 163 mg/dL (ref 0–200)
HDL: 44.6 mg/dL (ref >39.00)
NONHDL: 118.23
TRIGLYCERIDES: 203 mg/dL — AB (ref 0.0–149.0)
VLDL: 40.6 mg/dL — AB (ref 0.0–40.0)

## 2018-06-22 LAB — BASIC METABOLIC PANEL
BUN: 9 mg/dL (ref 6–23)
CHLORIDE: 96 meq/L (ref 96–112)
CO2: 31 meq/L (ref 19–32)
Calcium: 9.2 mg/dL (ref 8.4–10.5)
Creatinine, Ser: 1.29 mg/dL — ABNORMAL HIGH (ref 0.40–1.20)
GFR: 43.84 mL/min — AB (ref >60.00)
GLUCOSE: 101 mg/dL — AB (ref 70–99)
Potassium: 3.8 mEq/L (ref 3.5–5.1)
SODIUM: 132 meq/L — AB (ref 135–145)

## 2018-06-22 LAB — HEPATIC FUNCTION PANEL
ALT: 8 U/L (ref 0–35)
AST: 10 U/L (ref 0–37)
Albumin: 3.9 g/dL (ref 3.5–5.2)
Alkaline Phosphatase: 68 U/L (ref 39–117)
BILIRUBIN TOTAL: 0.4 mg/dL (ref 0.2–1.2)
Bilirubin, Direct: 0.1 mg/dL (ref 0.0–0.3)
Total Protein: 7.3 g/dL (ref 6.0–8.3)

## 2018-06-22 LAB — HEMOGLOBIN A1C: Hgb A1c MFr Bld: 6.3 % (ref 4.6–6.5)

## 2018-06-22 LAB — LDL CHOLESTEROL, DIRECT: Direct LDL: 95 mg/dL

## 2018-07-06 ENCOUNTER — Ambulatory Visit: Payer: Commercial Managed Care - PPO | Admitting: Internal Medicine

## 2018-07-06 ENCOUNTER — Encounter: Payer: Self-pay | Admitting: Internal Medicine

## 2018-07-06 VITALS — BP 176/100 | HR 96 | Temp 98.3°F | Resp 16 | Wt 163.0 lb

## 2018-07-06 DIAGNOSIS — N183 Chronic kidney disease, stage 3 unspecified: Secondary | ICD-10-CM | POA: Insufficient documentation

## 2018-07-06 DIAGNOSIS — R739 Hyperglycemia, unspecified: Secondary | ICD-10-CM | POA: Diagnosis not present

## 2018-07-06 DIAGNOSIS — I10 Essential (primary) hypertension: Secondary | ICD-10-CM

## 2018-07-06 DIAGNOSIS — J321 Chronic frontal sinusitis: Secondary | ICD-10-CM | POA: Diagnosis not present

## 2018-07-06 DIAGNOSIS — N1832 Chronic kidney disease, stage 3b: Secondary | ICD-10-CM | POA: Insufficient documentation

## 2018-07-06 DIAGNOSIS — R1031 Right lower quadrant pain: Secondary | ICD-10-CM

## 2018-07-06 DIAGNOSIS — Z0001 Encounter for general adult medical examination with abnormal findings: Secondary | ICD-10-CM

## 2018-07-06 MED ORDER — HYDROCHLOROTHIAZIDE 12.5 MG PO TABS: ORAL_TABLET | ORAL | 3 refills | Status: DC

## 2018-07-06 MED ORDER — TRIAMCINOLONE ACETONIDE 55 MCG/ACT NA AERO: 2.0000 | INHALATION_SPRAY | Freq: Every day | NASAL | 12 refills | Status: DC

## 2018-07-06 MED ORDER — ATORVASTATIN CALCIUM 20 MG PO TABS: 20.0000 mg | ORAL_TABLET | Freq: Every day | ORAL | 3 refills | Status: DC

## 2018-07-06 MED ORDER — CETIRIZINE HCL 10 MG PO TABS: 10.0000 mg | ORAL_TABLET | Freq: Every day | ORAL | 11 refills | Status: DC

## 2018-07-06 MED ORDER — LISINOPRIL 40 MG PO TABS: 40.0000 mg | ORAL_TABLET | Freq: Every day | ORAL | 3 refills | Status: DC

## 2018-07-06 NOTE — Patient Instructions (Addendum)
Please take all new medication as prescribed - the zyrtec and nasacort for the sinuses (sent to [facility])  Please call in 1 week for ENT referral if not improved  Please continue all other medications as before, and refills have been done to [facility]  Please have the pharmacy call with any other refills you may need.  Please continue your efforts at being more active, low cholesterol diet, and weight control.  You are otherwise up to date with prevention measures today.  Please keep your appointments with your specialists as you may have planned  You will be contacted regarding the referral for: Nephrology (kidney doctor)  Please return in 6 months, or sooner if needed

## 2018-07-06 NOTE — Assessment & Plan Note (Signed)
stable overall by history and exam, recent data reviewed with pt, and pt to continue medical treatment as before,  to f/u any worsening symptoms or concerns  

## 2018-07-06 NOTE — Assessment & Plan Note (Signed)
Mild to mod, for antibx course,  to f/u any worsening symptoms or concerns 

## 2018-07-06 NOTE — Assessment & Plan Note (Signed)
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

## 2018-07-06 NOTE — Assessment & Plan Note (Signed)
BP Readings from Last 3 Encounters:  07/06/18 (!) 176/100  12/30/17 136/84  07/28/17 118/62  out of meds, for med restart

## 2018-07-06 NOTE — Progress Notes (Signed)
Subjective:    Patient ID: Cindy Reid, female    DOB: May 14, 1951, 67 y.o.   MRN: [id number]  HPI  Here for wellness and f/u;  Overall doing ok;  Pt denies Chest pain, worsening SOB, DOE, wheezing, orthopnea, PND, worsening LE edema, palpitations, dizziness or syncope.  Pt denies neurological change such as new headache, facial or extremity weakness.  Pt denies polydipsia, polyuria, or low sugar symptoms. Pt states overall good compliance with treatment and medications, good tolerability, and has been trying to follow appropriate diet.  Pt denies worsening depressive symptoms, suicidal ideation or panic. No fever, night sweats, wt loss, loss of appetite, or other constitutional symptoms.  Pt states good ability with ADL's, has low fall risk, home safety reviewed and adequate, no other significant changes in hearing or vision, and only occasionally active with exercise. Retired since 2003.  Does also have right groin pain with internal and external rotation mild to mod, chronic persistent x months to a year.  Also has 2 wks onset  Right sinus facial pain, pressure, headache, general weakness and malaise, with mild non prod cough.   Past Medical History:  Diagnosis Date  . Carotid stenosis 07/28/2017   1-39% bilaterally 05/2015.  Marland Kitchen Epilepsy (HCC)   . Hyperlipidemia   . Hypertension   . Migraine    Past Surgical History:  Procedure Laterality Date  . COLONOSCOPY    . MOUTH SURGERY    . TUBAL LIGATION      reports that she has been smoking cigarettes. She has been smoking about 2.00 packs per day. She has never used smokeless tobacco. She reports that she drinks alcohol. She reports that she does not use drugs. family history includes Cancer in her father and mother; Colon cancer in her maternal aunt; Diabetes in her mother; Heart disease in her father. Allergies  Allergen Reactions  . Amlodipine     gingival hyperplasia  . Dilantin [Phenytoin] Other (See Comments)    UNSURE OF REACTION     Current Outpatient Medications on File Prior to Visit  Medication Sig Dispense Refill  . aspirin EC 81 MG tablet Take 1 tablet (81 mg total) by mouth daily. 90 tablet 11   No current facility-administered medications on file prior to visit.    Review of Systems Constitutional: Negative for other unusual diaphoresis, sweats, appetite or weight changes HENT: Negative for other worsening hearing loss, ear pain, facial swelling, mouth sores or neck stiffness.   Eyes: Negative for other worsening pain, redness or other visual disturbance.  Respiratory: Negative for other stridor or swelling Cardiovascular: Negative for other palpitations or other chest pain  Gastrointestinal: Negative for worsening diarrhea or loose stools, blood in stool, distention or other pain Genitourinary: Negative for hematuria, flank pain or other change in urine volume.  Musculoskeletal: Negative for myalgias or other joint swelling.  Skin: Negative for other color change, or other wound or worsening drainage.  Neurological: Negative for other syncope or numbness. Hematological: Negative for other adenopathy or swelling Psychiatric/Behavioral: Negative for hallucinations, other worsening agitation, SI, self-injury, or new decreased concentration All other system neg per pt    Objective:   Physical Exam BP (!) 176/100   Pulse 96   Temp 98.3 F (36.8 C) (Oral)   Resp 16   Wt 163 lb (73.9 kg)   SpO2 93%   BMI 29.34 kg/m ' VS noted,  Constitutional: Pt is oriented to person, place, and time. Appears well-developed and well-nourished, in no significant  distress and comfortable Head: Normocephalic and atraumatic  Eyes: Conjunctivae and EOM are normal. Pupils are equal, round, and reactive to light Right Ear: External ear normal without discharge Left Ear: External ear normal without discharge Nose: Nose without discharge or deformity Bilat tm's with mild erythema.  Right Max sinus areas mild tender.  Pharynx  with mild erythema, no exudate Mouth/Throat: Oropharynx is without other ulcerations and moist  Neck: Normal range of motion. Neck supple. No JVD present. No tracheal deviation present or significant neck LA or mass Cardiovascular: Normal rate, regular rhythm, normal heart sounds and intact distal pulses.   Pulmonary/Chest: WOB normal and breath sounds without rales or wheezing  Abdominal: Soft. Bowel sounds are normal. NT. No HSM  Musculoskeletal: Normal range of motion. Exhibits no edema Lymphadenopathy: Has no other cervical adenopathy.  Neurological: Pt is alert and oriented to person, place, and time. Pt has normal reflexes. No cranial nerve deficit. Motor grossly intact, Gait intact Skin: Skin is warm and dry. No rash noted or new ulcerations Psychiatric:  Has normal mood and affect. Behavior is normal without agitation No other exam findings  Lab Results  Component Value Date   WBC 8.6 12/22/2017   HGB 13.8 12/22/2017   HCT 41.0 12/22/2017   PLT 318.0 12/22/2017   GLUCOSE 101 (H) 06/22/2018   CHOL 163 06/22/2018   TRIG 203.0 (H) 06/22/2018   HDL 44.60 06/22/2018   LDLDIRECT 95.0 06/22/2018   LDLCALC 107 (H) 12/01/2015   ALT 8 06/22/2018   AST 10 06/22/2018   NA 132 (L) 06/22/2018   K 3.8 06/22/2018   CL 96 06/22/2018   CREATININE 1.29 (H) 06/22/2018   BUN 9 06/22/2018   CO2 31 06/22/2018   TSH 2.49 12/22/2017   HGBA1C 6.3 06/22/2018       Assessment & Plan:

## 2018-07-06 NOTE — Assessment & Plan Note (Addendum)
Chronic persistent, for nephrology referral  In addition to the time spent performing CPE, I spent an additional 25 minutes face to face,in which greater than 50% of this time was spent in counseling and coordination of care for patient's acute illness as documented, including the differential dx, treatment, further evaluation and other management of CKD, hyperglycemia, HTN, sinusitis and right groin pain

## 2018-07-28 ENCOUNTER — Ambulatory Visit: Payer: Commercial Managed Care - PPO | Admitting: Cardiovascular Disease

## 2018-07-28 ENCOUNTER — Encounter: Payer: Self-pay | Admitting: Cardiovascular Disease

## 2018-07-28 VITALS — BP 110/62 | HR 83 | Ht 62.0 in | Wt 162.8 lb

## 2018-07-28 DIAGNOSIS — I10 Essential (primary) hypertension: Secondary | ICD-10-CM | POA: Diagnosis not present

## 2018-07-28 DIAGNOSIS — Z720 Tobacco use: Secondary | ICD-10-CM

## 2018-07-28 DIAGNOSIS — Z5181 Encounter for therapeutic drug level monitoring: Secondary | ICD-10-CM

## 2018-07-28 DIAGNOSIS — E785 Hyperlipidemia, unspecified: Secondary | ICD-10-CM | POA: Diagnosis not present

## 2018-07-28 DIAGNOSIS — I6523 Occlusion and stenosis of bilateral carotid arteries: Secondary | ICD-10-CM | POA: Diagnosis not present

## 2018-07-28 MED ORDER — ATORVASTATIN CALCIUM 40 MG PO TABS: 40.0000 mg | ORAL_TABLET | Freq: Every day | ORAL | 1 refills | Status: DC

## 2018-07-28 NOTE — Progress Notes (Signed)
Cardiology Office Note   Date:  07/28/2018   ID:  Cindy Reid, DOB 12-08-1950, MRN [id number]  PCP:  Corwin Levins, MD  Cardiologist:   Chilton Si, MD   Chief Complaint  Patient presents with  . Follow-up    History of Present Illness: Cindy Reid is a 67 y.o. female with hypertension, hyperlipidemia, mild carotid stenosis, and palpitations who presents for follow up.  On 05/20/15 Cindy Reid was evaluated in the ED for a syncopal episode.  She was in the bathroom at [facility] with an upset stomach and diarrhea.  After using the bathroom and washing her hands, she was on her way out and the next thing she remembers she was on the floor.  There was no preceding chest pain, shortness of breath, palpitations, or warning. She has never had an episode like this in the past. She is brought to the ED for evaluation and was thought to have had an orthostatic hypotension episode.  Cindy Reid followed up with her primary care doctor who ordered an echocardiogram that was unremarkable aside from grade 1 diastolic dysfunction. A 48 hour Holter 05/2015 revealed sinus rhythm with rare PACs and PVCs.  She also had rare, short-lived, atrial runs that lasted for less than 2 seconds each.  She had a carotid ultrasound that showed mild bilateral ICA stenosis but no signficant obstruction.    Since her last appointment Ms. Cindy Reid has been feeling well.  She had a sinus infection last month and is still getting over it.  She has a tickle in her throat and has a dry cough.  She notes that she did not have the symptoms prior to her infection and does not think it is from her lisinopril.  She has not been doing much exercise.  She likes to sit and play games on her computer.  She has no exertional chest pain or shortness of breath.  She also denies lower extremity edema, orthopnea, or PND.  She does walk her dog occasionally.  She struggles with back pain when she stands for too long.  She has an elliptical  at her house but has not been using it.  She continues to smoke "too much.".  She is not ready to quit at this time.  Overall her diet has been pretty good.  She has lost 10 pounds by reducing carbs.   Past Medical History:  Diagnosis Date  . Carotid stenosis 07/28/2017   1-39% bilaterally 05/2015.  Marland Kitchen Epilepsy (HCC)   . Hyperlipidemia   . Hypertension   . Migraine     Past Surgical History:  Procedure Laterality Date  . COLONOSCOPY    . MOUTH SURGERY    . TUBAL LIGATION       Current Outpatient Medications  Medication Sig Dispense Refill  . aspirin EC 81 MG tablet Take 1 tablet (81 mg total) by mouth daily. 90 tablet 11  . atorvastatin (LIPITOR) 40 MG tablet Take 1 tablet (40 mg total) by mouth daily. 90 tablet 1  . cetirizine (ZYRTEC) 10 MG tablet Take 1 tablet (10 mg total) by mouth daily. 30 tablet 11  . hydrochlorothiazide (HYDRODIURIL) 12.5 MG tablet TAKE ONE-HALF (1/2) TABLET DAILY (NEW DOSE, REPLACES  CAPSULES) 45 tablet 3  . lisinopril (PRINIVIL,ZESTRIL) 40 MG tablet Take 1 tablet (40 mg total) by mouth daily. 90 tablet 3  . triamcinolone (NASACORT) 55 MCG/ACT AERO nasal inhaler Place 2 sprays into the nose daily. 1 Inhaler 12  No current facility-administered medications for this visit.     Allergies:   Amlodipine and Dilantin [phenytoin]    Social History:  The patient  reports that she has been smoking cigarettes. She has been smoking about 2.00 packs per day. She has never used smokeless tobacco. She reports that she drinks alcohol. She reports that she does not use drugs.   Family History:  [name] family history includes Cancer in her father and mother; Colon cancer in her maternal aunt; Diabetes in her mother; Heart disease in her father.    ROS:  Please see the history of present illness.  Otherwise, review of systems are positive for none.   All other systems are reviewed and negative.    PHYSICAL EXAM: VS:  BP 110/62   Pulse 83   Ht 5\' 2"  (1.575  m)   Wt 162 lb 12.8 oz (73.8 kg)   BMI 29.78 kg/m  , BMI Body mass index is 29.78 kg/m. GENERAL:  Well appearing HEENT: Pupils equal round and reactive, fundi not visualized, oral mucosa unremarkable NECK:  No jugular venous distention, waveform within normal limits, carotid upstroke brisk and symmetric, no bruits LUNGS:  Clear to auscultation bilaterally HEART:  RRR.  PMI not displaced or sustained,S1 and S2 within normal limits, no S3, no S4, no clicks, no rubs, no murmurs ABD:  Flat, positive bowel sounds normal in frequency in pitch, no bruits, no rebound, no guarding, no midline pulsatile mass, no hepatomegaly, no splenomegaly EXT:  2 plus pulses throughout, no edema, no cyanosis no clubbing SKIN:  No rashes no nodules NEURO:  Cranial nerves II through XII grossly intact, motor grossly intact throughout PSYCH:  Cognitively intact, oriented to person place and time   EKG:  EKG is ordered today. The ekg ordered today demonstrates sinus tachycardia at 104 bpm.   07/28/17: sinus tachycardia. Rate 103 bpm. Cannot rule out prior inferior infarct. 07/28/2018: Sinus rhythm.  Rate 83 bpm.  Carotid ultrasound 06/22/15: 1-39% bilateral ICA stenosis  TTE 06/20/15: LVEF 55-60%.  Moderate LVH.  Trivial AR.     Recent Labs: 12/22/2017: Hemoglobin 13.8; Platelets 318.0; TSH 2.49 06/22/2018: ALT 8; BUN 9; Creatinine, Ser 1.29; Potassium 3.8; Sodium 132    Lipid Panel    Component Value Date/Time   CHOL 163 06/22/2018 1357   TRIG 203.0 (H) 06/22/2018 1357   HDL 44.60 06/22/2018 1357   CHOLHDL 4 06/22/2018 1357   VLDL 40.6 (H) 06/22/2018 1357   LDLCALC 107 (H) 12/01/2015 0847   LDLDIRECT 95.0 06/22/2018 1357      Wt Readings from Last 3 Encounters:  07/28/18 162 lb 12.8 oz (73.8 kg)  07/06/18 163 lb (73.9 kg)  12/30/17 170 lb (77.1 kg)      Other studies Reviewed: Additional studies/ records that were reviewed today include: medical record . Review of the above records demonstrates:   Please see elsewhere in the note.     ASSESSMENT AND PLAN:  # Hypertension:  Blood pressure was initially elevated but was normal on repeat.  Continue HCTZ and lisinopril.   # Hyperlipidemia: LDL was 95 05/2018.  Given her carotid stenosis her goal is less than 70.  We will increase atorvastatin to 40 mg.  She will have lipids and a CMP checked in 3 months.  I also encouraged her to increase her exercise.  She has an elliptical that she will try to start using.  # Tobacco abuse: Ms. Crespi remains uninterested in smoking cessation at this time.  Today we will focus more on exercise and rediscuss in the future.  # Mild carotid stenosis: 1 to 39% ICA stenosis bilaterally 07/2017.  Continue aspirin and increase atorvastatin as above.   Current medicines are reviewed at length with the patient today.  The patient does not have concerns regarding medicines.  The following changes have been made: increase atorvastatin  Labs/ tests ordered today include:   Orders Placed This Encounter  Procedures  . Lipid panel  . Comprehensive metabolic panel  . EKG 12-Lead     Disposition:   FU with Dr. Elmarie Shiley C. [place] in 6 months.   Signed, Chilton Si, MD  07/28/2018 9:23 AM    [facility]

## 2018-07-28 NOTE — Patient Instructions (Signed)
Medication Instructions:  INCREASE ATORVASTATIN TO 40 MG DAILY   Labwork: FASTING LP/CMET IN 3 MONTHS   Testing/Procedures: NONE  Follow-Up: Your physician wants you to follow-up in: 6 MONTHS  You will receive a reminder letter in the mail two months in advance. If you don't receive a letter, please call our office to schedule the follow-up appointment.  TRY TO EXERCISE 150 MINUTES EACH WEEK   If you need a refill on your cardiac medications before your next appointment, please call your pharmacy.

## 2018-11-12 LAB — LIPID PANEL
CHOL/HDL RATIO: 3.6 ratio (ref 0.0–4.4)
Cholesterol, Total: 162 mg/dL (ref 100–199)
HDL: 45 mg/dL (ref >39)
LDL Calculated: 90 mg/dL (ref 0–99)
TRIGLYCERIDES: 136 mg/dL (ref 0–149)
VLDL CHOLESTEROL CAL: 27 mg/dL (ref 5–40)

## 2018-11-12 LAB — COMPREHENSIVE METABOLIC PANEL
A/G RATIO: 1.5 (ref 1.2–2.2)
ALBUMIN: 4.1 g/dL (ref 3.6–4.8)
ALK PHOS: 95 IU/L (ref 39–117)
ALT: 14 IU/L (ref 0–32)
AST: 12 IU/L (ref 0–40)
BILIRUBIN TOTAL: 0.3 mg/dL (ref 0.0–1.2)
BUN / CREAT RATIO: 10 — AB (ref 12–28)
BUN: 11 mg/dL (ref 8–27)
CHLORIDE: 94 mmol/L — AB (ref 96–106)
CO2: 24 mmol/L (ref 20–29)
Calcium: 8.9 mg/dL (ref 8.7–10.3)
Creatinine, Ser: 1.15 mg/dL — ABNORMAL HIGH (ref 0.57–1.00)
GFR calc non Af Amer: 49 mL/min/{1.73_m2} — ABNORMAL LOW (ref >59)
GFR, EST AFRICAN AMERICAN: 57 mL/min/{1.73_m2} — AB (ref >59)
Globulin, Total: 2.7 g/dL (ref 1.5–4.5)
Glucose: 77 mg/dL (ref 65–99)
Potassium: 4.8 mmol/L (ref 3.5–5.2)
SODIUM: 133 mmol/L — AB (ref 134–144)
TOTAL PROTEIN: 6.8 g/dL (ref 6.0–8.5)

## 2018-12-11 ENCOUNTER — Telehealth: Payer: Self-pay | Admitting: *Deleted

## 2018-12-11 MED ORDER — ATORVASTATIN CALCIUM 80 MG PO TABS: 80.0000 mg | ORAL_TABLET | Freq: Every day | ORAL | 3 refills | Status: DC

## 2018-12-11 NOTE — Telephone Encounter (Signed)
Notes recorded by Chilton Si, MD on 12/05/2018 at 7:50 PM EST Cholesterol levels look much better. Triglycerides are much lower. LDL should be <70. Recommend increasing atorvastatin to 80mg .  Repeat lipids/CMP in 3 months. Kidney function and liver function are stable.  Spoke with pt, aware of dr Leonides Sake recommendations. New script sent to the pharmacy and she has a follow appointment in may and will fasting so labs can be drawn at appointment.

## 2019-01-04 ENCOUNTER — Ambulatory Visit (INDEPENDENT_AMBULATORY_CARE_PROVIDER_SITE_OTHER): Payer: Commercial Managed Care - PPO | Admitting: Internal Medicine

## 2019-01-04 ENCOUNTER — Other Ambulatory Visit: Payer: Self-pay | Admitting: Internal Medicine

## 2019-01-04 ENCOUNTER — Encounter: Payer: Self-pay | Admitting: Internal Medicine

## 2019-01-04 ENCOUNTER — Other Ambulatory Visit (INDEPENDENT_AMBULATORY_CARE_PROVIDER_SITE_OTHER): Payer: Commercial Managed Care - PPO

## 2019-01-04 VITALS — BP 134/82 | Temp 97.9°F | Ht 62.0 in | Wt 164.0 lb

## 2019-01-04 DIAGNOSIS — N183 Chronic kidney disease, stage 3 unspecified: Secondary | ICD-10-CM

## 2019-01-04 DIAGNOSIS — I10 Essential (primary) hypertension: Secondary | ICD-10-CM | POA: Diagnosis not present

## 2019-01-04 DIAGNOSIS — R3129 Other microscopic hematuria: Secondary | ICD-10-CM

## 2019-01-04 DIAGNOSIS — Z0000 Encounter for general adult medical examination without abnormal findings: Secondary | ICD-10-CM | POA: Diagnosis not present

## 2019-01-04 DIAGNOSIS — R739 Hyperglycemia, unspecified: Secondary | ICD-10-CM

## 2019-01-04 LAB — LIPID PANEL
CHOL/HDL RATIO: 3
Cholesterol: 146 mg/dL (ref 0–200)
HDL: 47.7 mg/dL (ref >39.00)
LDL Cholesterol: 74 mg/dL (ref 0–99)
NONHDL: 98.46
Triglycerides: 121 mg/dL (ref 0.0–149.0)
VLDL: 24.2 mg/dL (ref 0.0–40.0)

## 2019-01-04 LAB — CBC WITH DIFFERENTIAL/PLATELET
Basophils Absolute: 0.1 10*3/uL (ref 0.0–0.1)
Basophils Relative: 1.2 % (ref 0.0–3.0)
Eosinophils Absolute: 0.2 10*3/uL (ref 0.0–0.7)
Eosinophils Relative: 2.5 % (ref 0.0–5.0)
HCT: 41.8 % (ref 36.0–46.0)
HEMOGLOBIN: 13.9 g/dL (ref 12.0–15.0)
LYMPHS PCT: 30.9 % (ref 12.0–46.0)
Lymphs Abs: 2.7 10*3/uL (ref 0.7–4.0)
MCHC: 33.3 g/dL (ref 30.0–36.0)
MCV: 85.1 fl (ref 78.0–100.0)
MONOS PCT: 6.4 % (ref 3.0–12.0)
Monocytes Absolute: 0.6 10*3/uL (ref 0.1–1.0)
Neutro Abs: 5.1 10*3/uL (ref 1.4–7.7)
Neutrophils Relative %: 59 % (ref 43.0–77.0)
Platelets: 253 10*3/uL (ref 150.0–400.0)
RBC: 4.91 Mil/uL (ref 3.87–5.11)
RDW: 14.2 % (ref 11.5–15.5)
WBC: 8.6 10*3/uL (ref 4.0–10.5)

## 2019-01-04 LAB — URINALYSIS, ROUTINE W REFLEX MICROSCOPIC
Bilirubin Urine: NEGATIVE
Ketones, ur: NEGATIVE
Leukocytes,Ua: NEGATIVE
NITRITE: NEGATIVE
SPECIFIC GRAVITY, URINE: 1.01 (ref 1.000–1.030)
Total Protein, Urine: NEGATIVE
Urine Glucose: NEGATIVE
Urobilinogen, UA: 0.2 (ref 0.0–1.0)
pH: 6.5 (ref 5.0–8.0)

## 2019-01-04 LAB — BASIC METABOLIC PANEL
BUN: 10 mg/dL (ref 6–23)
CO2: 30 mEq/L (ref 19–32)
Calcium: 9.1 mg/dL (ref 8.4–10.5)
Chloride: 96 mEq/L (ref 96–112)
Creatinine, Ser: 1.25 mg/dL — ABNORMAL HIGH (ref 0.40–1.20)
GFR: 42.7 mL/min — ABNORMAL LOW (ref >60.00)
Glucose, Bld: 97 mg/dL (ref 70–99)
Potassium: 4.2 mEq/L (ref 3.5–5.1)
Sodium: 133 mEq/L — ABNORMAL LOW (ref 135–145)

## 2019-01-04 LAB — TSH: TSH: 1.62 u[IU]/mL (ref 0.35–4.50)

## 2019-01-04 LAB — HEPATIC FUNCTION PANEL
ALT: 14 U/L (ref 0–35)
AST: 15 U/L (ref 0–37)
Albumin: 4 g/dL (ref 3.5–5.2)
Alkaline Phosphatase: 72 U/L (ref 39–117)
Bilirubin, Direct: 0.1 mg/dL (ref 0.0–0.3)
Total Bilirubin: 0.4 mg/dL (ref 0.2–1.2)
Total Protein: 7.2 g/dL (ref 6.0–8.3)

## 2019-01-04 LAB — HEMOGLOBIN A1C: Hgb A1c MFr Bld: 6.1 % (ref 4.6–6.5)

## 2019-01-04 NOTE — Assessment & Plan Note (Signed)

## 2019-01-04 NOTE — Patient Instructions (Signed)

## 2019-01-04 NOTE — Progress Notes (Signed)
Subjective:    Patient ID: Cindy Reid, female    DOB: 01-01-1951, 68 y.o.   MRN: [id number]  HPI  Here for wellness and f/u;  Overall doing ok;  Pt denies Chest pain, worsening SOB, DOE, wheezing, orthopnea, PND, worsening LE edema, palpitations, dizziness or syncope.  Pt denies neurological change such as new headache, facial or extremity weakness.  Pt denies polydipsia, polyuria, or low sugar symptoms. Pt states overall good compliance with treatment and medications, good tolerability, and has been trying to follow appropriate diet.  Pt denies worsening depressive symptoms, suicidal ideation or panic. No fever, night sweats, wt loss, loss of appetite, or other constitutional symptoms.  Pt states good ability with ADL's, has low fall risk, home safety reviewed and adequate, no other significant changes in hearing or vision, and only occasionally active with exercise.  No new complaints Past Medical History:  Diagnosis Date  . Carotid stenosis 07/28/2017   1-39% bilaterally 05/2015.  Marland Kitchen Epilepsy (HCC)   . Hyperlipidemia   . Hypertension   . Migraine    Past Surgical History:  Procedure Laterality Date  . COLONOSCOPY    . MOUTH SURGERY    . TUBAL LIGATION      reports that she has been smoking cigarettes. She has been smoking about 2.00 packs per day. She has never used smokeless tobacco. She reports current alcohol use. She reports that she does not use drugs. family history includes Cancer in her father and mother; Colon cancer in her maternal aunt; Diabetes in her mother; Heart disease in her father. Allergies  Allergen Reactions  . Amlodipine     gingival hyperplasia  . Dilantin [Phenytoin] Other (See Comments)    UNSURE OF REACTION   Current Outpatient Medications on File Prior to Visit  Medication Sig Dispense Refill  . aspirin EC 81 MG tablet Take 1 tablet (81 mg total) by mouth daily. 90 tablet 11  . atorvastatin (LIPITOR) 80 MG tablet Take 1 tablet (80 mg total) by mouth  daily. 90 tablet 3  . cetirizine (ZYRTEC) 10 MG tablet Take 1 tablet (10 mg total) by mouth daily. 30 tablet 11  . hydrochlorothiazide (HYDRODIURIL) 12.5 MG tablet TAKE ONE-HALF (1/2) TABLET DAILY (NEW DOSE, REPLACES  CAPSULES) 45 tablet 3  . triamcinolone (NASACORT) 55 MCG/ACT AERO nasal inhaler Place 2 sprays into the nose daily. 1 Inhaler 12  . lisinopril (PRINIVIL,ZESTRIL) 40 MG tablet Take 1 tablet (40 mg total) by mouth daily. 90 tablet 3   No current facility-administered medications on file prior to visit.    Review of Systems Constitutional: Negative for other unusual diaphoresis, sweats, appetite or weight changes HENT: Negative for other worsening hearing loss, ear pain, facial swelling, mouth sores or neck stiffness.   Eyes: Negative for other worsening pain, redness or other visual disturbance.  Respiratory: Negative for other stridor or swelling Cardiovascular: Negative for other palpitations or other chest pain  Gastrointestinal: Negative for worsening diarrhea or loose stools, blood in stool, distention or other pain Genitourinary: Negative for hematuria, flank pain or other change in urine volume.  Musculoskeletal: Negative for myalgias or other joint swelling.  Skin: Negative for other color change, or other wound or worsening drainage.  Neurological: Negative for other syncope or numbness. Hematological: Negative for other adenopathy or swelling Psychiatric/Behavioral: Negative for hallucinations, other worsening agitation, SI, self-injury, or new decreased concentration All other system neg per pt    Objective:   Physical Exam BP 134/82   Temp 97.9  F (36.6 C) (Oral)   Ht 5\' 2"  (1.575 m)   Wt 164 lb (74.4 kg)   BMI 30.00 kg/m  VS noted,  Constitutional: Pt is oriented to person, place, and time. Appears well-developed and well-nourished, in no significant distress and comfortable Head: Normocephalic and atraumatic  Eyes: Conjunctivae and EOM are normal. Pupils  are equal, round, and reactive to light Right Ear: External ear normal without discharge Left Ear: External ear normal without discharge Nose: Nose without discharge or deformity Mouth/Throat: Oropharynx is without other ulcerations and moist  Neck: Normal range of motion. Neck supple. No JVD present. No tracheal deviation present or significant neck LA or mass Cardiovascular: Normal rate, regular rhythm, normal heart sounds and intact distal pulses.   Pulmonary/Chest: WOB normal and breath sounds without rales or wheezing  Abdominal: Soft. Bowel sounds are normal. NT. No HSM  Musculoskeletal: Normal range of motion. Exhibits no edema Lymphadenopathy: Has no other cervical adenopathy.  Neurological: Pt is alert and oriented to person, place, and time. Pt has normal reflexes. No cranial nerve deficit. Motor grossly intact, Gait intact Skin: Skin is warm and dry. No rash noted or new ulcerations Psychiatric:  Has normal mood and affect. Behavior is normal without agitation No other exam findings Lab Results  Component Value Date   WBC 8.6 01/04/2019   HGB 13.9 01/04/2019   HCT 41.8 01/04/2019   PLT 253.0 01/04/2019   GLUCOSE 97 01/04/2019   CHOL 146 01/04/2019   TRIG 121.0 01/04/2019   HDL 47.70 01/04/2019   LDLDIRECT 95.0 06/22/2018   LDLCALC 74 01/04/2019   ALT 14 01/04/2019   AST 15 01/04/2019   NA 133 (L) 01/04/2019   K 4.2 01/04/2019   CL 96 01/04/2019   CREATININE 1.25 (H) 01/04/2019   BUN 10 01/04/2019   CO2 30 01/04/2019   TSH 1.62 01/04/2019   HGBA1C 6.1 01/04/2019          Assessment & Plan:

## 2019-01-04 NOTE — Assessment & Plan Note (Signed)
stable overall by history and exam, recent data reviewed with pt, and pt to continue medical treatment as before,  to f/u any worsening symptoms or concerns  

## 2019-01-05 ENCOUNTER — Telehealth: Payer: Self-pay

## 2019-01-05 NOTE — Telephone Encounter (Signed)
-----   Message from Corwin Levins, MD sent at 01/04/2019  6:16 PM EDT ----- Left message on MyChart, pt to cont same tx except  The test results show that your current treatment is OK, as all the tests are stable except for the findings of a small amount of blood in the urine.  We should refer you to Urology for further consideration.  You should hopefully hear soon    Cindy Reid to please inform pt, I will do referral

## 2019-01-05 NOTE — Telephone Encounter (Signed)
Pt has viewed results via MyChart  

## 2019-02-18 ENCOUNTER — Telehealth: Payer: Self-pay

## 2019-02-18 NOTE — Telephone Encounter (Signed)
UNABLE TO LEAVE VM

## 2019-03-01 NOTE — Telephone Encounter (Signed)
error 

## 2019-03-04 ENCOUNTER — Telehealth: Payer: Self-pay

## 2019-03-04 NOTE — Telephone Encounter (Signed)
LEFT MESSAGE FOR PT TO RETURN CALL TO OFFICE. PT NEEDS TO BE MOVED TO 03/11/2019 AT ANYTIME BEFORE 12PM ON THAT DAY

## 2019-03-04 NOTE — Telephone Encounter (Signed)
Called patient to discuss upcoming appointment with Dr Duke Salvia on 03/08/19. Patient requested to be seen in person. Advised patient that it could be at least August before office visits can be scheduled. Patient verbalized understanding.  Appoitnemnt cancelled and rescheduled for 07/12/19 @ 10a.

## 2019-03-08 ENCOUNTER — Ambulatory Visit: Payer: Commercial Managed Care - PPO | Admitting: Cardiovascular Disease

## 2019-07-02 ENCOUNTER — Other Ambulatory Visit (INDEPENDENT_AMBULATORY_CARE_PROVIDER_SITE_OTHER): Payer: Commercial Managed Care - PPO

## 2019-07-02 DIAGNOSIS — I10 Essential (primary) hypertension: Secondary | ICD-10-CM | POA: Diagnosis not present

## 2019-07-02 DIAGNOSIS — R739 Hyperglycemia, unspecified: Secondary | ICD-10-CM | POA: Diagnosis not present

## 2019-07-02 LAB — LIPID PANEL
Cholesterol: 153 mg/dL (ref 0–200)
HDL: 41 mg/dL (ref >39.00)
LDL Cholesterol: 76 mg/dL (ref 0–99)
NonHDL: 111.68
Total CHOL/HDL Ratio: 4
Triglycerides: 177 mg/dL — ABNORMAL HIGH (ref 0.0–149.0)
VLDL: 35.4 mg/dL (ref 0.0–40.0)

## 2019-07-02 LAB — BASIC METABOLIC PANEL
BUN: 17 mg/dL (ref 6–23)
CO2: 29 mEq/L (ref 19–32)
Calcium: 8.8 mg/dL (ref 8.4–10.5)
Chloride: 96 mEq/L (ref 96–112)
Creatinine, Ser: 1.48 mg/dL — ABNORMAL HIGH (ref 0.40–1.20)
GFR: 35.09 mL/min — ABNORMAL LOW (ref >60.00)
Glucose, Bld: 97 mg/dL (ref 70–99)
Potassium: 3.5 mEq/L (ref 3.5–5.1)
Sodium: 133 mEq/L — ABNORMAL LOW (ref 135–145)

## 2019-07-02 LAB — HEMOGLOBIN A1C: Hgb A1c MFr Bld: 6.3 % (ref 4.6–6.5)

## 2019-07-02 LAB — HEPATIC FUNCTION PANEL
ALT: 10 U/L (ref 0–35)
AST: 14 U/L (ref 0–37)
Albumin: 3.8 g/dL (ref 3.5–5.2)
Alkaline Phosphatase: 56 U/L (ref 39–117)
Bilirubin, Direct: 0 mg/dL (ref 0.0–0.3)
Total Bilirubin: 0.4 mg/dL (ref 0.2–1.2)
Total Protein: 7 g/dL (ref 6.0–8.3)

## 2019-07-05 ENCOUNTER — Other Ambulatory Visit: Payer: Self-pay | Admitting: Internal Medicine

## 2019-07-07 ENCOUNTER — Encounter: Payer: Self-pay | Admitting: Internal Medicine

## 2019-07-07 ENCOUNTER — Ambulatory Visit (INDEPENDENT_AMBULATORY_CARE_PROVIDER_SITE_OTHER): Payer: Commercial Managed Care - PPO | Admitting: Internal Medicine

## 2019-07-07 ENCOUNTER — Other Ambulatory Visit: Payer: Self-pay

## 2019-07-07 VITALS — BP 126/84 | HR 96 | Temp 97.9°F | Ht 62.0 in | Wt 157.0 lb

## 2019-07-07 DIAGNOSIS — E538 Deficiency of other specified B group vitamins: Secondary | ICD-10-CM

## 2019-07-07 DIAGNOSIS — R739 Hyperglycemia, unspecified: Secondary | ICD-10-CM | POA: Diagnosis not present

## 2019-07-07 DIAGNOSIS — Z0000 Encounter for general adult medical examination without abnormal findings: Secondary | ICD-10-CM

## 2019-07-07 DIAGNOSIS — N183 Chronic kidney disease, stage 3 unspecified: Secondary | ICD-10-CM

## 2019-07-07 DIAGNOSIS — I10 Essential (primary) hypertension: Secondary | ICD-10-CM

## 2019-07-07 DIAGNOSIS — E785 Hyperlipidemia, unspecified: Secondary | ICD-10-CM | POA: Diagnosis not present

## 2019-07-07 DIAGNOSIS — E611 Iron deficiency: Secondary | ICD-10-CM

## 2019-07-07 DIAGNOSIS — E559 Vitamin D deficiency, unspecified: Secondary | ICD-10-CM

## 2019-07-07 NOTE — Patient Instructions (Signed)
Please continue all other medications as before, and refills have been done if requested.  Please have the pharmacy call with any other refills you may need.  Please continue your efforts at being more active, low cholesterol diet, and weight control  Please keep your appointments with your specialists as you may have planned  You will be contacted regarding the referral for: Nephrology (kidney doctor)  Please return in 6 months, or sooner if needed, with Lab testing done 3-5 days before

## 2019-07-07 NOTE — Progress Notes (Signed)
Subjective:    Patient ID: Cindy Reid, female    DOB: 1951-09-05, 68 y.o.   MRN: [id number]  HPI  Here to f/u; overall doing ok,  Pt denies chest pain, increasing sob or doe, wheezing, orthopnea, PND, increased LE swelling, palpitations, dizziness or syncope.  Pt denies new neurological symptoms such as new headache, or facial or extremity weakness or numbness.  Pt denies polydipsia, polyuria, or low sugar episode.  Pt states overall good compliance with meds, mostly trying to follow appropriate diet, with wt overall stable,  but little exercise however.  No new complaints Wt Readings from Last 3 Encounters:  07/07/19 157 lb (71.2 kg)  01/04/19 164 lb (74.4 kg)  07/28/18 162 lb 12.8 oz (73.8 kg)   Past Medical History:  Diagnosis Date  . Carotid stenosis 07/28/2017   1-39% bilaterally 05/2015.  Marland Kitchen Epilepsy ([place])   . Hyperlipidemia   . Hypertension   . Migraine    Past Surgical History:  Procedure Laterality Date  . COLONOSCOPY    . MOUTH SURGERY    . TUBAL LIGATION      reports that she has been smoking cigarettes. She has been smoking about 2.00 packs per day. She has never used smokeless tobacco. She reports current alcohol use. She reports that she does not use drugs. family history includes Cancer in her father and mother; Colon cancer in her maternal aunt; Diabetes in her mother; Heart disease in her father. Allergies  Allergen Reactions  . Amlodipine     gingival hyperplasia  . Dilantin [Phenytoin] Other (See Comments)    UNSURE OF REACTION   Current Outpatient Medications on File Prior to Visit  Medication Sig Dispense Refill  . aspirin EC 81 MG tablet Take 1 tablet (81 mg total) by mouth daily. 90 tablet 11  . atorvastatin (LIPITOR) 80 MG tablet Take 1 tablet (80 mg total) by mouth daily. 90 tablet 3  . cetirizine (ZYRTEC) 10 MG tablet TAKE 1 TABLET(10 MG) BY MOUTH DAILY 30 tablet 5  . hydrochlorothiazide (HYDRODIURIL) 12.5 MG tablet TAKE ONE-HALF (1/2) TABLET  DAILY (NEW DOSE, REPLACES  CAPSULES) 45 tablet 3  . triamcinolone (NASACORT) 55 MCG/ACT AERO nasal inhaler Place 2 sprays into the nose daily. 1 Inhaler 12  . lisinopril (PRINIVIL,ZESTRIL) 40 MG tablet Take 1 tablet (40 mg total) by mouth daily. 90 tablet 3   No current facility-administered medications on file prior to visit.    Review of Systems  Constitutional: Negative for other unusual diaphoresis or sweats HENT: Negative for ear discharge or swelling Eyes: Negative for other worsening visual disturbances Respiratory: Negative for stridor or other swelling  Gastrointestinal: Negative for worsening distension or other blood Genitourinary: Negative for retention or other urinary change Musculoskeletal: Negative for other MSK pain or swelling Skin: Negative for color change or other new lesions Neurological: Negative for worsening tremors and other numbness  Psychiatric/Behavioral: Negative for worsening agitation or other fatigue All other system neg per pt    Objective:   Physical Exam BP 126/84   Pulse 96   Temp 97.9 F (36.6 C) (Oral)   Ht 5\' 2"  (1.575 m)   Wt 157 lb (71.2 kg)   SpO2 97%   BMI 28.72 kg/m  VS noted,  Constitutional: Pt appears in NAD HENT: Head: NCAT.  Right Ear: External ear normal.  Left Ear: External ear normal.  Eyes: . Pupils are equal, round, and reactive to light. Conjunctivae and EOM are normal Nose: without d/c or  deformity Neck: Neck supple. Gross normal ROM Cardiovascular: Normal rate and regular rhythm.   Pulmonary/Chest: Effort normal and breath sounds without rales or wheezing.  Abd:  Soft, NT, ND, + BS, no organomegaly Neurological: Pt is alert. At baseline orientation, motor grossly intact Skin: Skin is warm. No rashes, other new lesions, no LE edema Psychiatric: Pt behavior is normal without agitation  No other exam findings Lab Results  Component Value Date   WBC 8.6 01/04/2019   HGB 13.9 01/04/2019   HCT 41.8 01/04/2019   PLT  253.0 01/04/2019   GLUCOSE 97 07/02/2019   CHOL 153 07/02/2019   TRIG 177.0 (H) 07/02/2019   HDL 41.00 07/02/2019   LDLDIRECT 95.0 06/22/2018   LDLCALC 76 07/02/2019   ALT 10 07/02/2019   AST 14 07/02/2019   NA 133 (L) 07/02/2019   K 3.5 07/02/2019   CL 96 07/02/2019   CREATININE 1.48 (H) 07/02/2019   BUN 17 07/02/2019   CO2 29 07/02/2019   TSH 1.62 01/04/2019   HGBA1C 6.3 07/02/2019        Assessment & Plan:

## 2019-07-10 ENCOUNTER — Encounter: Payer: Self-pay | Admitting: Internal Medicine

## 2019-07-10 NOTE — Assessment & Plan Note (Signed)
stable overall by history and exam, recent data reviewed with pt, and pt to continue medical treatment as before,  to f/u any worsening symptoms or concerns  

## 2019-07-10 NOTE — Assessment & Plan Note (Signed)
?   Worsening, for renal referral, cont same tx

## 2019-07-12 ENCOUNTER — Ambulatory Visit: Payer: Commercial Managed Care - PPO | Admitting: Cardiovascular Disease

## 2019-07-12 ENCOUNTER — Encounter: Payer: Self-pay | Admitting: Cardiovascular Disease

## 2019-07-12 ENCOUNTER — Other Ambulatory Visit: Payer: Self-pay

## 2019-07-12 VITALS — BP 124/82 | HR 76 | Ht 62.0 in | Wt 159.0 lb

## 2019-07-12 DIAGNOSIS — I10 Essential (primary) hypertension: Secondary | ICD-10-CM | POA: Diagnosis not present

## 2019-07-12 DIAGNOSIS — I6523 Occlusion and stenosis of bilateral carotid arteries: Secondary | ICD-10-CM | POA: Diagnosis not present

## 2019-07-12 DIAGNOSIS — Z720 Tobacco use: Secondary | ICD-10-CM | POA: Diagnosis not present

## 2019-07-12 DIAGNOSIS — E7800 Pure hypercholesterolemia, unspecified: Secondary | ICD-10-CM

## 2019-07-12 NOTE — Progress Notes (Signed)
Cardiology Office Note   Date:  07/12/2019   ID:  Cindy Reid, DOB 01/26/1951, MRN [id number]  PCP:  Corwin LevinsJohn, James W, MD  Cardiologist:   Chilton Siiffany Corydon, MD   No chief complaint on file.   History of Present Illness: Cindy Reid is a 68 y.o. female with hypertension, hyperlipidemia, mild carotid stenosis, and palpitations who presents for follow up.  On 05/20/15 Cindy Reid was evaluated in the ED for a syncopal episode.  [facility] and [facility].  After using the bathroom and washing her hands, she was on her way out and the next thing she remembers she was on the floor.  There was no preceding chest pain, shortness of breath, palpitations, or warning. She has never had an episode like this in the past. She is brought to the ED for evaluation and was thought to have had an orthostatic hypotension episode.  Cindy Reid followed up with her primary care doctor who ordered an echocardiogram that was unremarkable aside from grade 1 diastolic dysfunction. A 48 hour Holter 05/2015 revealed sinus rhythm with rare PACs and PVCs.  She also had rare, short-lived, atrial runs that lasted for less than 2 seconds each.  She had a carotid ultrasound that showed mild bilateral ICA stenosis but no signficant obstruction.    Since her last appointment Cindy Reid has been doing well.  She stays active but doesn't get much formal exercise.  She does work around [employer] house, walks her dogs, and feeds her animals.  She has no exertional chest pain or shortness of breath.  Based on her scales at home she thinks she has been losing weight.  She attributes this to not eating as many carbs.  She continues to smoke "too much.".  She is not ready to quit yet.  She has not experienced any chest pain and her breathing has been stable.  She denies lower extremity edema, orthopnea, or PND.  She also denies palpitations, lightheadedness, and dizziness.     Past Medical  History:  Diagnosis Date  . Carotid stenosis 07/28/2017   1-39% bilaterally 05/2015.  Marland Kitchen. Epilepsy (HCC)   . Hyperlipidemia   . Hypertension   . Migraine     Past Surgical History:  Procedure Laterality Date  . COLONOSCOPY    . MOUTH SURGERY    . TUBAL LIGATION       Current Outpatient Medications  Medication Sig Dispense Refill  . aspirin EC 81 MG tablet Take 1 tablet (81 mg total) by mouth daily. 90 tablet 11  . atorvastatin (LIPITOR) 80 MG tablet Take 1 tablet (80 mg total) by mouth daily. 90 tablet 3  . cetirizine (ZYRTEC) 10 MG tablet TAKE 1 TABLET(10 MG) BY MOUTH DAILY 30 tablet 5  . hydrochlorothiazide (HYDRODIURIL) 12.5 MG tablet TAKE ONE-HALF (1/2) TABLET DAILY (NEW DOSE, REPLACES  CAPSULES) 45 tablet 3  . triamcinolone (NASACORT) 55 MCG/ACT AERO nasal inhaler Place 2 sprays into the nose daily. 1 Inhaler 12  . lisinopril (PRINIVIL,ZESTRIL) 40 MG tablet Take 1 tablet (40 mg total) by mouth daily. 90 tablet 3   No current facility-administered medications for this visit.     Allergies:   Amlodipine and Dilantin [phenytoin]    Social History:  The patient  reports that she has been smoking cigarettes. She has been smoking about 2.00 packs per day. She has never used smokeless tobacco. She reports current alcohol use. She reports that  she does not use drugs.   Family History:  [name] family history includes Cancer in her father and mother; Colon cancer in her maternal aunt; Diabetes in her mother; Heart disease in her father.    ROS:  Please see the history of present illness.  Otherwise, review of systems are positive for none.   All other systems are reviewed and negative.    PHYSICAL EXAM: VS:  BP 124/82   Pulse 76   Ht 5\' 2"  (1.575 m)   Wt 159 lb (72.1 kg)   SpO2 95%   BMI 29.08 kg/m  , BMI Body mass index is 29.08 kg/m. GENERAL:  Well appearing HEENT: Pupils equal round and reactive, fundi not visualized, oral mucosa unremarkable NECK:  No jugular  venous distention, waveform within normal limits, carotid upstroke brisk and symmetric, no bruits LUNGS:  Clear to auscultation bilaterally HEART:  RRR.  PMI not displaced or sustained,S1 and S2 within normal limits, no S3, no S4, no clicks, no rubs, no murmurs ABD:  Flat, positive bowel sounds normal in frequency in pitch, no bruits, no rebound, no guarding, no midline pulsatile mass, no hepatomegaly, no splenomegaly EXT:  2 plus pulses throughout, no edema, no cyanosis no clubbing SKIN:  No rashes no nodules NEURO:  Cranial nerves II through XII grossly intact, motor grossly intact throughout PSYCH:  Cognitively intact, oriented to person place and time   EKG:  EKG is ordered today. The ekg ordered today demonstrates sinus tachycardia at 104 bpm.   07/28/17: sinus tachycardia. Rate 103 bpm. Cannot rule out prior inferior infarct. 07/28/2018: Sinus rhythm.  Rate 83 bpm. 07/12/2019: Sinus rhythm.  Rate 85 bpm. Carotid ultrasound 06/22/15: 1-39% bilateral ICA stenosis  TTE 06/20/15: LVEF 55-60%.  Moderate LVH.  Trivial AR.     Recent Labs: 01/04/2019: Hemoglobin 13.9; Platelets 253.0; TSH 1.62 07/02/2019: ALT 10; BUN 17; Creatinine, Ser 1.48; Potassium 3.5; Sodium 133    Lipid Panel    Component Value Date/Time   CHOL 153 07/02/2019 1337   CHOL 162 11/12/2018 0858   TRIG 177.0 (H) 07/02/2019 1337   HDL 41.00 07/02/2019 1337   HDL 45 11/12/2018 0858   CHOLHDL 4 07/02/2019 1337   VLDL 35.4 07/02/2019 1337   LDLCALC 76 07/02/2019 1337   LDLCALC 90 11/12/2018 0858   LDLDIRECT 95.0 06/22/2018 1357      Wt Readings from Last 3 Encounters:  07/12/19 159 lb (72.1 kg)  07/07/19 157 lb (71.2 kg)  01/04/19 164 lb (74.4 kg)      Other studies Reviewed: Additional studies/ records that were reviewed today include: medical record . Review of the above records demonstrates:  Please see elsewhere in the note.     ASSESSMENT AND PLAN:  # Hypertension:  Blood pressure remains  well-controlled on HCTZ and lisinopril.   # Hyperlipidemia: LDL was 95 05/2018.  Given her carotid stenosis her goal is less than 70.  We will increase atorvastatin to 80 mg.  She will have lipids and a CMP checked in 3 months.  I also encouraged her to increase her exercise.  She has an elliptical that she will try to start using.  # Tobacco abuse: Cindy Reid remains uninterested in smoking cessation at this time.  Today we will focus more on exercise and rediscuss in the future.  # Mild carotid stenosis: 1 to 39% ICA stenosis bilaterally 07/2017.  Continue aspirin and increase atorvastatin as above.  Repeat carotid Dopplers.  Current medicines are reviewed at  length with the patient today.  The patient does not have concerns regarding medicines.  The following changes have been made: increase atorvastatin  Labs/ tests ordered today include:   No orders of the defined types were placed in this encounter.    Disposition:   FU with Dr. Elmarie Shiley C. Skillman in 6 months.   Signed, Chilton Si, MD  07/12/2019 10:28 AM    [facility]

## 2019-07-12 NOTE — Patient Instructions (Signed)
Medication Instructions:  Your physician recommends that you continue on your current medications as directed. Please refer to the Current Medication list given to you today.  If you need a refill on your cardiac medications before your next appointment, please call your pharmacy.   Lab work: NONE  Testing/Procedures: Your physician has requested that you have a carotid duplex. This test is an ultrasound of the carotid arteries in your neck. It looks at blood flow through these arteries that supply the brain with blood. Allow one hour for this exam. There are no restrictions or special instructions.  Follow-Up: At [facility], you and your health needs are our priority.  As part of our continuing mission to provide you with exceptional heart care, we have created designated Provider Care Teams.  These Care Teams include your primary Cardiologist (physician) and Advanced Practice Providers (APPs -  Physician Assistants and Nurse Practitioners) who all work together to provide you with the care you need, when you need it. You will need a follow up appointment in 12 months.  Please call our office 2 months in advance to schedule this appointment.  You may see DR [facility] or one of the following Advanced Practice Providers on your designated Care Team:   Kerin Ransom, PA-C Roby Lofts, [place] . Sande Rives, PA-C

## 2019-07-19 ENCOUNTER — Encounter: Payer: Self-pay | Admitting: Cardiovascular Disease

## 2019-07-20 ENCOUNTER — Ambulatory Visit (HOSPITAL_COMMUNITY)
Admission: RE | Admit: 2019-07-20 | Discharge: 2019-07-20 | Disposition: A | Discharge status: Home / Self Care | Payer: Commercial Managed Care - PPO | Source: Ambulatory Visit | Attending: Cardiovascular Disease | Admitting: Cardiovascular Disease

## 2019-07-20 ENCOUNTER — Other Ambulatory Visit: Payer: Self-pay

## 2019-07-20 DIAGNOSIS — I6523 Occlusion and stenosis of bilateral carotid arteries: Secondary | ICD-10-CM | POA: Diagnosis not present

## 2019-07-22 ENCOUNTER — Other Ambulatory Visit: Payer: Self-pay | Admitting: *Deleted

## 2019-09-17 ENCOUNTER — Other Ambulatory Visit: Payer: Self-pay | Admitting: Internal Medicine

## 2019-10-01 ENCOUNTER — Other Ambulatory Visit: Payer: Self-pay | Admitting: Internal Medicine

## 2019-12-06 ENCOUNTER — Ambulatory Visit: Payer: Commercial Managed Care - PPO | Attending: Internal Medicine

## 2019-12-06 DIAGNOSIS — Z23 Encounter for immunization: Secondary | ICD-10-CM

## 2019-12-06 NOTE — Progress Notes (Signed)
   Covid-19 Vaccination Clinic  Name:  ADASHA BOEHME    MRN: [id number] DOB: December 14, 1950  12/06/2019  Ms. Shelton was observed post Covid-19 immunization for 15 minutes without incidence. She was provided with Vaccine Information Sheet and instruction to access the V-Safe system.   Ms. Papin was instructed to call 911 with any severe reactions post vaccine: Marland Kitchen Difficulty breathing  . Swelling of your face and throat  . A fast heartbeat  . A bad rash all over your body  . Dizziness and weakness    Immunizations Administered    Name Date Dose VIS Date Route   Pfizer COVID-19 Vaccine 12/06/2019  6:06 PM 0.3 mL 10/08/2019 Intramuscular   Manufacturer: [employer], [employer]   Lot: [id number]   NDC: [address]-6

## 2019-12-16 ENCOUNTER — Other Ambulatory Visit: Payer: Self-pay | Admitting: Cardiovascular Disease

## 2019-12-22 ENCOUNTER — Other Ambulatory Visit: Payer: Self-pay

## 2019-12-22 MED ORDER — CETIRIZINE HCL 10 MG PO TABS: ORAL_TABLET | ORAL | 5 refills | Status: DC

## 2019-12-31 ENCOUNTER — Ambulatory Visit: Payer: Commercial Managed Care - PPO | Attending: Internal Medicine

## 2019-12-31 DIAGNOSIS — Z23 Encounter for immunization: Secondary | ICD-10-CM | POA: Insufficient documentation

## 2019-12-31 NOTE — Progress Notes (Signed)
   Covid-19 Vaccination Clinic  Name:  Cindy Reid    MRN: [id number] DOB: 28-Dec-1950  12/31/2019  Ms. Cindy Reid was observed post Covid-19 immunization for 15 minutes without incident. She was provided with Vaccine Information Sheet and instruction to access the V-Safe system.   Ms. Cindy Reid was instructed to call 911 with any severe reactions post vaccine: Marland Kitchen Difficulty breathing  . Swelling of face and throat  . A fast heartbeat  . A bad rash all over body  . Dizziness and weakness   Immunizations Administered    Name Date Dose VIS Date Route   Pfizer COVID-19 Vaccine 12/31/2019  5:06 AM 0.3 mL 10/08/2019 Intramuscular   Manufacturer: [employer], [employer]   Lot: [id number]   NDC: [address]-4

## 2020-01-05 ENCOUNTER — Ambulatory Visit: Payer: Commercial Managed Care - PPO | Admitting: Internal Medicine

## 2020-01-05 ENCOUNTER — Other Ambulatory Visit: Payer: Self-pay

## 2020-01-05 ENCOUNTER — Encounter: Payer: Self-pay | Admitting: Internal Medicine

## 2020-01-05 VITALS — BP 142/84 | HR 56 | Temp 98.0°F | Ht 62.0 in | Wt 154.6 lb

## 2020-01-05 DIAGNOSIS — Z0000 Encounter for general adult medical examination without abnormal findings: Secondary | ICD-10-CM

## 2020-01-05 DIAGNOSIS — E785 Hyperlipidemia, unspecified: Secondary | ICD-10-CM | POA: Diagnosis not present

## 2020-01-05 DIAGNOSIS — R739 Hyperglycemia, unspecified: Secondary | ICD-10-CM

## 2020-01-05 DIAGNOSIS — N183 Chronic kidney disease, stage 3 unspecified: Secondary | ICD-10-CM

## 2020-01-05 DIAGNOSIS — I10 Essential (primary) hypertension: Secondary | ICD-10-CM

## 2020-01-05 DIAGNOSIS — E538 Deficiency of other specified B group vitamins: Secondary | ICD-10-CM

## 2020-01-05 NOTE — Patient Instructions (Signed)

## 2020-01-05 NOTE — Progress Notes (Signed)
Subjective:    Patient ID: Cindy Reid, female    DOB: 10-Oct-1951, 69 y.o.   MRN: [id number]  HPI  Here for wellness and f/u;  Overall doing ok;  Pt denies Chest pain, worsening SOB, DOE, wheezing, orthopnea, PND, worsening LE edema, palpitations, dizziness or syncope.  Pt denies neurological change such as new headache, facial or extremity weakness.  Pt denies polydipsia, polyuria, or low sugar symptoms. Pt states overall good compliance with treatment and medications, good tolerability, and has been trying to follow appropriate diet.  Pt denies worsening depressive symptoms, suicidal ideation or panic. No fever, night sweats, wt loss, loss of appetite, or other constitutional symptoms.  Pt states good ability with ADL's, has low fall risk, home safety reviewed and adequate, no other significant changes in hearing or vision, and only occasionally active with exercise. Has full lab panel per renal from Dec 28 2019. Past Medical History:  Diagnosis Date  . Carotid stenosis 07/28/2017   1-39% bilaterally 05/2015.  [facility] Epilepsy (HCC)   . Hyperlipidemia   . Hypertension   . Migraine    Past Surgical History:  Procedure Laterality Date  . COLONOSCOPY    . MOUTH SURGERY    . TUBAL LIGATION      reports that she has been smoking cigarettes. She has been smoking about 2.00 packs per day. She has never used smokeless tobacco. She reports current alcohol use. She reports that she does not use drugs. family history includes Cancer in her father and mother; Colon cancer in her maternal aunt; Diabetes in her mother; Heart disease in her father. Allergies  Allergen Reactions  . Amlodipine     gingival hyperplasia  . Dilantin [Phenytoin] Other (See Comments)    UNSURE OF REACTION   Current Outpatient Medications on File Prior to Visit  Medication Sig Dispense Refill  . aspirin EC 81 MG tablet Take 1 tablet (81 mg total) by mouth daily. 90 tablet 11  . atorvastatin (LIPITOR) 80 MG tablet TAKE 1  TABLET DAILY (NEW DOSE. DISCONTINUE PREVIOUS PRESCRIPTION) 90 tablet 3  . cetirizine (ZYRTEC) 10 MG tablet TAKE 1 TABLET(10 MG) BY MOUTH DAILY 30 tablet 5  . hydrochlorothiazide (HYDRODIURIL) 12.5 MG tablet TAKE ONE-HALF (1/2) TABLET DAILY 45 tablet 3  . lisinopril (ZESTRIL) 40 MG tablet TAKE 1 TABLET DAILY 90 tablet 3  . triamcinolone (NASACORT) 55 MCG/ACT AERO nasal inhaler Place 2 sprays into the nose daily. 1 Inhaler 12   No current facility-administered medications on file prior to visit.   Review of Systems All otherwise neg per pt     Objective:   Physical Exam BP (!) 142/84   Pulse (!) 56   Temp 98 F (36.7 C)   Ht 5\' 2"  (1.575 m)   Wt 154 lb 9.6 oz (70.1 kg)   SpO2 95%   BMI 28.28 kg/m  VS noted,  Constitutional: Pt appears in NAD HENT: Head: NCAT.  Right Ear: External ear normal.  Left Ear: External ear normal.  Eyes: . Pupils are equal, round, and reactive to light. Conjunctivae and EOM are normal Nose: without d/c or deformity Neck: Neck supple. Gross normal ROM Cardiovascular: Normal rate and regular rhythm.   Pulmonary/Chest: Effort normal and breath sounds without rales or wheezing.  Abd:  Soft, NT, ND, + BS, no organomegaly Neurological: Pt is alert. At baseline orientation, motor grossly intact Skin: Skin is warm. No rashes, other new lesions, no LE edema Psychiatric: Pt behavior is normal without agitation  All otherwise neg per pt Lab Results  Component Value Date   WBC 8.6 01/04/2019   HGB 13.9 01/04/2019   HCT 41.8 01/04/2019   PLT 253.0 01/04/2019   GLUCOSE 97 07/02/2019   CHOL 153 07/02/2019   TRIG 177.0 (H) 07/02/2019   HDL 41.00 07/02/2019   LDLDIRECT 95.0 06/22/2018   LDLCALC 76 07/02/2019   ALT 10 07/02/2019   AST 14 07/02/2019   NA 133 (L) 07/02/2019   K 3.5 07/02/2019   CL 96 07/02/2019   CREATININE 1.48 (H) 07/02/2019   BUN 17 07/02/2019   CO2 29 07/02/2019   TSH 1.62 01/04/2019   HGBA1C 6.3 07/02/2019      Assessment & Plan:

## 2020-01-09 ENCOUNTER — Encounter: Payer: Self-pay | Admitting: Internal Medicine

## 2020-01-09 NOTE — Assessment & Plan Note (Signed)
For f/u renal as planned 

## 2020-01-09 NOTE — Assessment & Plan Note (Signed)
stable overall by history and exam, recent data reviewed with pt, and pt to continue medical treatment as before,  to f/u any worsening symptoms or concerns  

## 2020-01-09 NOTE — Assessment & Plan Note (Signed)

## 2020-01-09 NOTE — Assessment & Plan Note (Signed)
stable overall by history and exam, recent data reviewed with pt, and pt to continue medical treatment as before,  to f/u any worsening symptoms or concerns, check BP at home, declines med change for now

## 2020-07-13 ENCOUNTER — Encounter: Payer: Self-pay | Admitting: Cardiovascular Disease

## 2020-07-13 ENCOUNTER — Other Ambulatory Visit: Payer: Self-pay

## 2020-07-13 ENCOUNTER — Ambulatory Visit: Payer: Commercial Managed Care - PPO | Admitting: Cardiovascular Disease

## 2020-07-13 VITALS — BP 120/70 | HR 83 | Ht 62.0 in | Wt 153.0 lb

## 2020-07-13 DIAGNOSIS — E7800 Pure hypercholesterolemia, unspecified: Secondary | ICD-10-CM

## 2020-07-13 DIAGNOSIS — I10 Essential (primary) hypertension: Secondary | ICD-10-CM

## 2020-07-13 DIAGNOSIS — I6523 Occlusion and stenosis of bilateral carotid arteries: Secondary | ICD-10-CM

## 2020-07-13 DIAGNOSIS — I739 Peripheral vascular disease, unspecified: Secondary | ICD-10-CM

## 2020-07-13 DIAGNOSIS — Z5181 Encounter for therapeutic drug level monitoring: Secondary | ICD-10-CM

## 2020-07-13 HISTORY — DX: Peripheral vascular disease, unspecified: I73.9

## 2020-07-13 NOTE — Progress Notes (Signed)
Cardiology Office Note   Date:  07/13/2020   ID:  Cindy Reid, DOB May 15, 1951, MRN [id number]  PCP:  Corwin Levins, MD  Cardiologist:   Chilton Si, MD   No chief complaint on file.   History of Present Illness: Cindy Reid is a 69 y.o. female with hypertension, hyperlipidemia, mild carotid stenosis, and palpitations who presents for follow up.  On 05/20/15 Cindy Reid was evaluated in the ED for a syncopal episode.  She was in the bathroom at [facility] with an upset stomach and diarrhea.  After using the bathroom and washing her hands, she was on her way out and the next thing she remembers she was on the floor.  There was no preceding chest pain, shortness of breath, palpitations, or warning. She has never had an episode like this in the past. She is brought to the ED for evaluation and was thought to have had an orthostatic hypotension episode.  Cindy Reid followed up with her primary care doctor who ordered an echocardiogram that was unremarkable aside from grade 1 diastolic dysfunction. A 48 hour Holter 05/2015 revealed sinus rhythm with rare PACs and PVCs.  She also had rare, short-lived, atrial runs that lasted for less than 2 seconds each.  She had a carotid ultrasound that showed mild bilateral ICA stenosis but no signficant obstruction.    Since her last appointment Cindy Reid has been doing well.  She got a new puppy and walks regularly. She has no chest pain or shortness of breath when walking.  She has noted that she gets some discomfort in her calves when walking up inclines.  She feels okay on flat land's.  She checks her blood pressure at home at times and it typically is well-controlled.  She notes that it is always higher when she comes to the office.  She did take her blood pressure medication today.  She notes that her diet has not been great lately.  She also continues to smoke.  She goes through stages of wanting to quit.  She was able to quit when pregnant but  started back after her father passed.  She smokes over a pack a day.  She denies chest pain, shortness of breath, lower extremity edema, orthopnea, or PND.   Past Medical History:  Diagnosis Date   Carotid stenosis 07/28/2017   1-39% bilaterally 05/2015.   Claudication in peripheral vascular disease (HCC) 07/13/2020   Epilepsy (HCC)    Hyperlipidemia    Hypertension    Migraine     Past Surgical History:  Procedure Laterality Date   COLONOSCOPY     MOUTH SURGERY     TUBAL LIGATION       Current Outpatient Medications  Medication Sig Dispense Refill   aspirin EC 81 MG tablet Take 1 tablet (81 mg total) by mouth daily. 90 tablet 11   atorvastatin (LIPITOR) 80 MG tablet TAKE 1 TABLET DAILY (NEW DOSE. DISCONTINUE PREVIOUS PRESCRIPTION) 90 tablet 3   hydrochlorothiazide (HYDRODIURIL) 12.5 MG tablet TAKE ONE-HALF (1/2) TABLET DAILY 45 tablet 3   lisinopril (ZESTRIL) 40 MG tablet TAKE 1 TABLET DAILY 90 tablet 3   No current facility-administered medications for this visit.    Allergies:   Amlodipine and Dilantin [phenytoin]    Social History:  The patient  reports that she has been smoking cigarettes. She has been smoking about 2.00 packs per day. She has never used smokeless tobacco. She reports current alcohol use. She reports that she  does not use drugs.   Family History:  [name] family history includes Cancer in her father and mother; Colon cancer in her maternal aunt; Diabetes in her mother; Heart disease in her father.    ROS:  Please see the history of present illness.  Otherwise, review of systems are positive for none.   All other systems are reviewed and negative.    PHYSICAL EXAM: VS:  BP 120/70    Pulse 83    Ht 5\' 2"  (1.575 m)    Wt 153 lb (69.4 kg)    SpO2 99%    BMI 27.98 kg/m  , BMI Body mass index is 27.98 kg/m. GENERAL:  Well appearing HEENT: Pupils equal round and reactive, fundi not visualized, oral mucosa unremarkable NECK:  No jugular  venous distention, waveform within normal limits, carotid upstroke brisk and symmetric, no bruits LUNGS:  Clear to auscultation bilaterally HEART:  RRR.  PMI not displaced or sustained,S1 and S2 within normal limits, no S3, no S4, no clicks, no rubs, no murmurs ABD:  Flat, positive bowel sounds normal in frequency in pitch, no bruits, no rebound, no guarding, no midline pulsatile mass, no hepatomegaly, no splenomegaly EXT:  1+ DP/PT bilaterally. No edema, no cyanosis no clubbing SKIN:  No rashes no nodules NEURO:  Cranial nerves II through XII grossly intact, motor grossly intact throughout PSYCH:  Cognitively intact, oriented to person place and time   EKG:  EKG is ordered today. The ekg ordered today demonstrates sinus tachycardia at 104 bpm.   07/28/17: sinus tachycardia. Rate 103 bpm. Cannot rule out prior inferior infarct. 07/28/2018: Sinus rhythm.  Rate 83 bpm. 07/12/2019: Sinus rhythm.  Rate 85 bpm. 07/13/2020: Sinus rhythm.  Rate 83 bpm.  Cannot rule out prior inferior infarct.   Carotid ultrasound 06/22/15: 1-39% bilateral ICA stenosis  TTE 06/20/15: LVEF 55-60%.  Moderate LVH.  Trivial AR.     Recent Labs: No results found for requested labs within last 8760 hours.    Lipid Panel    Component Value Date/Time   CHOL 153 07/02/2019 1337   CHOL 162 11/12/2018 0858   TRIG 177.0 (H) 07/02/2019 1337   HDL 41.00 07/02/2019 1337   HDL 45 11/12/2018 0858   CHOLHDL 4 07/02/2019 1337   VLDL 35.4 07/02/2019 1337   LDLCALC 76 07/02/2019 1337   LDLCALC 90 11/12/2018 0858   LDLDIRECT 95.0 06/22/2018 1357      Wt Readings from Last 3 Encounters:  07/13/20 153 lb (69.4 kg)  01/05/20 154 lb 9.6 oz (70.1 kg)  07/12/19 159 lb (72.1 kg)      Other studies Reviewed: Additional studies/ records that were reviewed today include: medical record . Review of the above records demonstrates:  Please see elsewhere in the note.     ASSESSMENT AND PLAN:  # Hypertension:  Blood pressure  was slightly above goal.  She reports that it has been controlled at home.  Continue HCTZ and lisinopril.   # Hyperlipidemia: LDL was 95 05/2018.  Given her carotid stenosis her goal is less than 70.  Was increased to 80 mg.  We will check fasting lipids and a CMP.  # Tobacco abuse: Cindy Reid remains uninterested in smoking cessation at this time.  We discussed using patches.  She would need to start at 21 mg.  She understands that these are available over-the-counter.  She was unsuccessful with quitting with Chantix in the past.  # Mild carotid stenosis: 1 to 39% ICA stenosis bilaterally  07/2017 and 06/2019.  Continue aspirin and atorvastatin.    # Claudication:  Claudication going up hill and DP/PT are diminished.  Check ABIs.  Current medicines are reviewed at length with the patient today.  The patient does not have concerns regarding medicines.  The following changes have been made: increase atorvastatin  Labs/ tests ordered today include:   Orders Placed This Encounter  Procedures   Comprehensive metabolic panel   Lipid panel   EKG 12-Lead   VAS [place] ABI WITH/WO TBI   VAS [place] LOWER EXTREMITY ARTERIAL DUPLEX     Disposition:   FU with Dr. Elmarie Shiley C. Duke Salvia in 1 year.   Signed, Chilton Si, MD  07/13/2020 10:15 AM    [facility]

## 2020-07-13 NOTE — Patient Instructions (Addendum)
Medication Instructions:  Your physician recommends that you continue on your current medications as directed. Please refer to the Current Medication list given to you today.  *If you need a refill on your cardiac medications before your next appointment, please call your pharmacy*  Lab Work: FASTING LP/CMET SOON   If you have labs (blood work) drawn today and your tests are completely normal, you will receive your results only by:  MyChart Message (if you have MyChart) OR  A paper copy in the mail If you have any lab test that is abnormal or we need to change your treatment, we will call you to review the results.  Testing/Procedures: Your physician has requested that you have an ankle brachial index (ABI). During this test an ultrasound and blood pressure cuff are used to evaluate the arteries that supply the arms and legs with blood. Allow thirty minutes for this exam. There are no restrictions or special instructions.  Follow-Up: At [facility], you and your health needs are our priority.  As part of our continuing mission to provide you with exceptional heart care, we have created designated Provider Care Teams.  These Care Teams include your primary Cardiologist (physician) and Advanced Practice Providers (APPs -  Physician Assistants and Nurse Practitioners) who all work together to provide you with the care you need, when you need it.  We recommend signing up for the patient portal called "MyChart".  Sign up information is provided on this After Visit Summary.  MyChart is used to connect with patients for Virtual Visits (Telemedicine).  Patients are able to view lab/test results, encounter notes, upcoming appointments, etc.  Non-urgent messages can be sent to your provider as well.   To learn more about what you can do with MyChart, go to [email or web address].    Your next appointment:   12 month(s)   The format for your next appointment:   In Person  Provider:   You may  see DR [facility]  or one of the following Advanced Practice Providers on your designated Care Team:    Corine Shelter, PA-C  Daryel November  [place], [place]   [phone])

## 2020-08-14 ENCOUNTER — Ambulatory Visit (HOSPITAL_COMMUNITY): Payer: Commercial Managed Care - PPO

## 2020-08-30 ENCOUNTER — Ambulatory Visit (HOSPITAL_COMMUNITY)
Admission: RE | Admit: 2020-08-30 | Discharge: 2020-08-30 | Disposition: A | Discharge status: Home / Self Care | Payer: Commercial Managed Care - PPO | Source: Ambulatory Visit | Attending: Cardiology | Admitting: Cardiology

## 2020-08-30 ENCOUNTER — Other Ambulatory Visit (HOSPITAL_COMMUNITY): Payer: Self-pay | Admitting: Cardiovascular Disease

## 2020-08-30 ENCOUNTER — Other Ambulatory Visit: Payer: Self-pay

## 2020-08-30 DIAGNOSIS — E7800 Pure hypercholesterolemia, unspecified: Secondary | ICD-10-CM | POA: Diagnosis not present

## 2020-08-30 DIAGNOSIS — I739 Peripheral vascular disease, unspecified: Secondary | ICD-10-CM | POA: Diagnosis present

## 2020-08-30 DIAGNOSIS — I10 Essential (primary) hypertension: Secondary | ICD-10-CM | POA: Diagnosis not present

## 2020-08-31 LAB — COMPREHENSIVE METABOLIC PANEL
ALT: 16 IU/L (ref 0–32)
AST: 19 IU/L (ref 0–40)
Albumin/Globulin Ratio: 1.4 (ref 1.2–2.2)
Albumin: 4.4 g/dL (ref 3.8–4.8)
Alkaline Phosphatase: 73 IU/L (ref 44–121)
BUN/Creatinine Ratio: 11 — ABNORMAL LOW (ref 12–28)
BUN: 14 mg/dL (ref 8–27)
Bilirubin Total: 0.3 mg/dL (ref 0.0–1.2)
CO2: 25 mmol/L (ref 20–29)
Calcium: 9.8 mg/dL (ref 8.7–10.3)
Chloride: 96 mmol/L (ref 96–106)
Creatinine, Ser: 1.33 mg/dL — ABNORMAL HIGH (ref 0.57–1.00)
GFR calc Af Amer: 47 mL/min/{1.73_m2} — ABNORMAL LOW (ref >59)
GFR calc non Af Amer: 41 mL/min/{1.73_m2} — ABNORMAL LOW (ref >59)
Globulin, Total: 3.1 g/dL (ref 1.5–4.5)
Glucose: 92 mg/dL (ref 65–99)
Potassium: 4.8 mmol/L (ref 3.5–5.2)
Sodium: 133 mmol/L — ABNORMAL LOW (ref 134–144)
Total Protein: 7.5 g/dL (ref 6.0–8.5)

## 2020-08-31 LAB — LIPID PANEL
Chol/HDL Ratio: 2.6 ratio (ref 0.0–4.4)
Cholesterol, Total: 143 mg/dL (ref 100–199)
HDL: 55 mg/dL (ref >39)
LDL Chol Calc (NIH): 65 mg/dL (ref 0–99)
Triglycerides: 135 mg/dL (ref 0–149)
VLDL Cholesterol Cal: 23 mg/dL (ref 5–40)

## 2020-09-05 ENCOUNTER — Other Ambulatory Visit: Payer: Self-pay

## 2020-09-05 ENCOUNTER — Ambulatory Visit: Payer: Commercial Managed Care - PPO | Admitting: Cardiovascular Disease

## 2020-09-05 ENCOUNTER — Encounter: Payer: Self-pay | Admitting: Cardiovascular Disease

## 2020-09-05 VITALS — BP 132/70 | HR 80 | Ht 62.0 in | Wt 150.4 lb

## 2020-09-05 DIAGNOSIS — I10 Essential (primary) hypertension: Secondary | ICD-10-CM | POA: Diagnosis not present

## 2020-09-05 DIAGNOSIS — Z720 Tobacco use: Secondary | ICD-10-CM

## 2020-09-05 DIAGNOSIS — I739 Peripheral vascular disease, unspecified: Secondary | ICD-10-CM | POA: Diagnosis not present

## 2020-09-05 DIAGNOSIS — E785 Hyperlipidemia, unspecified: Secondary | ICD-10-CM | POA: Diagnosis not present

## 2020-09-05 NOTE — Progress Notes (Signed)
Cardiology Office Note   Date:  09/05/2020   ID:  Cindy Reid, DOB 1951-06-08, MRN [id number]  PCP:  Cindy Levins, MD  Cardiologist: Dr. Duke Salvia  No chief complaint on file.     History of Present Illness: Cindy Reid is a 69 y.o. female who was referred by Dr. Duke Salvia for evaluation management of peripheral arterial disease. She has known history of essential hypertension, hyperlipidemia, mild carotid disease, extensive tobacco use and palpitations. She recently complained of bilateral exertional calf pain mostly with incline.  She underwent lower extremity arterial Doppler early this month which showed an ABI of 1.05 on the right and 0.81 on the left.  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  No significant infrainguinal disease on the left side.  There was moderate right iliac disease and some SFA disease.  She reports mostly left leg claudication but her symptoms are somewhat atypical.  She feels more limited by shortness of breath then by claudication itself.  Most of her symptoms are noticeable only with incline.  She has no rest pain and no lower extremity ulceration.  She denies any chest pain but does have chronic exertional dyspnea.  She continues to smoke 2 packs/day and has been doing so for more than 40 years.  Past Medical History:  Diagnosis Date  . Carotid stenosis 07/28/2017   1-39% bilaterally 05/2015.  . Claudication in peripheral vascular disease (HCC) 07/13/2020  . Epilepsy (HCC)   . Hyperlipidemia   . Hypertension   . Migraine     Past Surgical History:  Procedure Laterality Date  . COLONOSCOPY    . MOUTH SURGERY    . TUBAL LIGATION       Current Outpatient Medications  Medication Sig Dispense Refill  . aspirin EC 81 MG tablet Take 1 tablet (81 mg total) by mouth daily. 90 tablet 11  . atorvastatin (LIPITOR) 80 MG tablet TAKE 1 TABLET DAILY (NEW DOSE. DISCONTINUE PREVIOUS PRESCRIPTION) 90 tablet 3  .  hydrochlorothiazide (HYDRODIURIL) 12.5 MG tablet TAKE ONE-HALF (1/2) TABLET DAILY 45 tablet 3  . lisinopril (ZESTRIL) 40 MG tablet TAKE 1 TABLET DAILY 90 tablet 3   No current facility-administered medications for this visit.    Allergies:   Amlodipine and Dilantin [phenytoin]    Social History:  The patient  reports that she has been smoking cigarettes. She has been smoking about 2.00 packs per day. She has never used smokeless tobacco. She reports current alcohol use. She reports that she does not use drugs.   Family History:  [name] family history includes Cancer in her father and mother; Colon cancer in her maternal aunt; Diabetes in her mother; Heart disease in her father.    ROS:  Please see the history of present illness.   Otherwise, review of systems are positive for none.   All other systems are reviewed and negative.    PHYSICAL EXAM: VS:  BP 132/70 (BP Location: Left Arm, Patient Position: Sitting)   Pulse 80   Ht 5\' 2"  (1.575 m)   Wt 150 lb 6.4 oz (68.2 kg)   SpO2 99%   BMI 27.51 kg/m  , BMI Body mass index is 27.51 kg/m. GEN: Well nourished, well developed, in no acute distress  HEENT: normal  Neck: no JVD, carotid bruits, or masses Cardiac: RRR; no murmurs, rubs, or gallops,no edema  Respiratory:  clear to auscultation bilaterally, normal work of breathing GI: soft, nontender, nondistended, + BS MS: no  deformity or atrophy  Skin: warm and dry, no rash Neuro:  Strength and sensation are intact Psych: euthymic mood, full affect Vascular: Radial pulses +1 on the right and +2 on the left, femoral pulse: +2 on the right and +1 on the left, posterior tibial: +1 on the right and 0 on the left, dorsalis pedis: 0 bilaterally   EKG:  EKG is not ordered today.    Recent Labs: 08/30/2020: ALT 16; BUN 14; Creatinine, Ser 1.33; Potassium 4.8; Sodium 133    Lipid Panel    Component Value Date/Time   CHOL 143 08/30/2020 1457   TRIG 135 08/30/2020 1457   HDL 55  08/30/2020 1457   CHOLHDL 2.6 08/30/2020 1457   CHOLHDL 4 07/02/2019 1337   VLDL 35.4 07/02/2019 1337   LDLCALC 65 08/30/2020 1457   LDLDIRECT 95.0 06/22/2018 1357      Wt Readings from Last 3 Encounters:  09/05/20 150 lb 6.4 oz (68.2 kg)  07/13/20 153 lb (69.4 kg)  01/05/20 154 lb 9.6 oz (70.1 kg)       No flowsheet data found.    ASSESSMENT AND PLAN:  1.  Peripheral arterial disease: The patient has what seems to be mild to moderate left leg claudication which does not seem to be lifestyle limiting.  She seems to be more limited by shortness of breath than claudication.  In spite of an occluded left common iliac artery, she has a palpable left common femoral artery pulse which is surprising but likely indicates chronicity and well-developed collaterals.  I discussed with her the natural history and management of peripheral arterial disease.  She does not feel that her claudication symptoms are lifestyle limiting and thus I recommend continuing medical therapy.  There is no evidence of critical limb ischemia.  I stressed to her the importance of smoking cessation.  Continue treatment of risk factors and I encouraged her to start a regular walking exercise program.  Reevaluate symptoms in 6 months.  2.  Tobacco use: I discussed with her the importance of smoking cessation and provided instructions.  3.  Hyperlipidemia: Continue treatment with atorvastatin.  Most recent lipid profile showed an LDL of 65.  4.  Essential hypertension: Continue antihypertensive medications.    Disposition:   FU with me in 6 months  Signed,  Lorine Bears, MD  09/05/2020 1:27 PM    [facility]

## 2020-09-05 NOTE — Patient Instructions (Signed)
Medication Instructions:  No changes *If you need a refill on your cardiac medications before your next appointment, please call your pharmacy*   Lab Work: None ordered If you have labs (blood work) drawn today and your tests are completely normal, you will receive your results only by: Marland Kitchen MyChart Message (if you have MyChart) OR . A paper copy in the mail If you have any lab test that is abnormal or we need to change your treatment, we will call you to review the results.   Testing/Procedures: None ordered   Follow-Up: At [facility], you and your health needs are our priority.  As part of our continuing mission to provide you with exceptional heart care, we have created designated Provider Care Teams.  These Care Teams include your primary Cardiologist (physician) and Advanced Practice Providers (APPs -  Physician Assistants and Nurse Practitioners) who all work together to provide you with the care you need, when you need it.  We recommend signing up for the patient portal called "MyChart".  Sign up information is provided on this After Visit Summary.  MyChart is used to connect with patients for Virtual Visits (Telemedicine).  Patients are able to view lab/test results, encounter notes, upcoming appointments, etc.  Non-urgent messages can be sent to your provider as well.   To learn more about what you can do with MyChart, go to [email or web address].    Your next appointment:   6 month(s)  The format for your next appointment:   In Person  Provider:   Lorine Bears, MD   Other Instructions  Steps to Quit Smoking Smoking tobacco is the leading cause of preventable death. It can affect almost every organ in the body. Smoking puts you and people around you at risk for many serious, long-lasting (chronic) diseases. Quitting smoking can be hard, but it is one of the best things that you can do for your health. It is never too late to quit. How do I get ready to quit? When  you decide to quit smoking, make a plan to help you succeed. Before you quit:  Pick a date to quit. Set a date within the next 2 weeks to give you time to prepare.  Write down the reasons why you are quitting. Keep this list in places where you will see it often.  Tell your family, friends, and co-workers that you are quitting. Their support is important.  Talk with your doctor about the choices that may help you quit.  Find out if your health insurance will pay for these treatments.  Know the people, places, things, and activities that make you want to smoke (triggers). Avoid them. What first steps can I take to quit smoking?  Throw away all cigarettes at home, at work, and in your car.  Throw away the things that you use when you smoke, such as ashtrays and lighters.  Clean your car. Make sure to empty the ashtray.  Clean your home, including curtains and carpets. What can I do to help me quit smoking? Talk with your doctor about taking medicines and seeing a counselor at the same time. You are more likely to succeed when you do both.  If you are pregnant or breastfeeding, talk with your doctor about counseling or other ways to quit smoking. Do not take medicine to help you quit smoking unless your doctor tells you to do so. To quit smoking: Quit right away  Quit smoking totally, instead of slowly cutting back on how much  you smoke over a period of time.  Go to counseling. You are more likely to quit if you go to counseling sessions regularly. Take medicine You may take medicines to help you quit. Some medicines need a prescription, and some you can buy over-the-counter. Some medicines may contain a drug called nicotine to replace the nicotine in cigarettes. Medicines may:  Help you to stop having the desire to smoke (cravings).  Help to stop the problems that come when you stop smoking (withdrawal symptoms). Your doctor may ask you to use:  Nicotine patches, gum, or  lozenges.  Nicotine inhalers or sprays.  Non-nicotine medicine that is taken by mouth. Find resources Find resources and other ways to help you quit smoking and remain smoke-free after you quit. These resources are most helpful when you use them often. They include:  Online chats with a Veterinary surgeon.  Phone quitlines.  Printed Materials engineer.  Support groups or group counseling.  Text messaging programs.  Mobile phone apps. Use apps on your mobile phone or tablet that can help you stick to your quit plan. There are many free apps for mobile phones and tablets as well as websites. Examples include Quit Guide from the [employer] and smokefree.gov  What things can I do to make it easier to quit?   Talk to your family and friends. Ask them to support and encourage you.  Call a phone quitline (1-800-QUIT-NOW), reach out to support groups, or work with a Veterinary surgeon.  Ask people who smoke to not smoke around you.  Avoid places that make you want to smoke, such as: ? Bars. ? Parties. ? Smoke-break areas at work.  Spend time with people who do not smoke.  Lower the stress in your life. Stress can make you want to smoke. Try these things to help your stress: ? Getting regular exercise. ? Doing deep-breathing exercises. ? Doing yoga. ? Meditating. ? Doing a body scan. To do this, close your eyes, focus on one area of your body at a time from head to toe. Notice which parts of your body are tense. Try to relax the muscles in those areas. How will I feel when I quit smoking? Day 1 to 3 weeks Within the first 24 hours, you may start to have some problems that come from quitting tobacco. These problems are very bad 2-3 days after you quit, but they do not often last for more than 2-3 weeks. You may get these symptoms:  Mood swings.  Feeling restless, nervous, angry, or annoyed.  Trouble concentrating.  Dizziness.  Strong desire for high-sugar foods and nicotine.  Weight  gain.  Trouble pooping (constipation).  Feeling like you may vomit (nausea).  Coughing or a sore throat.  Changes in how the medicines that you take for other issues work in your body.  Depression.  Trouble sleeping (insomnia). Week 3 and afterward After the first 2-3 weeks of quitting, you may start to notice more positive results, such as:  Better sense of smell and taste.  Less coughing and sore throat.  Slower heart rate.  Lower blood pressure.  Clearer skin.  Better breathing.  Fewer sick days. Quitting smoking can be hard. Do not give up if you fail the first time. Some people need to try a few times before they succeed. Do your best to stick to your quit plan, and talk with your doctor if you have any questions or concerns. Summary  Smoking tobacco is the leading cause of preventable death. Quitting  smoking can be hard, but it is one of the best things that you can do for your health.  When you decide to quit smoking, make a plan to help you succeed.  Quit smoking right away, not slowly over a period of time.  When you start quitting, seek help from your doctor, family, or friends. This information is not intended to replace advice given to you by your health care provider. Make sure you discuss any questions you have with your health care provider. Document Revised: 07/09/2019 Document Reviewed: 01/02/2019 Elsevier Patient Education  2020 [employer].

## 2020-09-11 ENCOUNTER — Other Ambulatory Visit: Payer: Self-pay | Admitting: Internal Medicine

## 2020-09-11 NOTE — Telephone Encounter (Signed)
Please refill as per office routine med refill policy (all routine meds refilled for 3 mo or monthly per pt preference up to one year from last visit, then month to month grace period for 3 mo, then further med refills will have to be denied)  

## 2020-09-25 ENCOUNTER — Other Ambulatory Visit: Payer: Self-pay | Admitting: Internal Medicine

## 2020-09-25 NOTE — Telephone Encounter (Signed)
Please refill as per office routine med refill policy (all routine meds refilled for 3 mo or monthly per pt preference up to one year from last visit, then month to month grace period for 3 mo, then further med refills will have to be denied)  

## 2020-12-11 ENCOUNTER — Other Ambulatory Visit: Payer: Self-pay | Admitting: Cardiovascular Disease

## 2021-01-02 ENCOUNTER — Telehealth: Payer: Self-pay

## 2021-01-02 ENCOUNTER — Other Ambulatory Visit (INDEPENDENT_AMBULATORY_CARE_PROVIDER_SITE_OTHER): Payer: Commercial Managed Care - PPO

## 2021-01-02 DIAGNOSIS — Z0000 Encounter for general adult medical examination without abnormal findings: Secondary | ICD-10-CM

## 2021-01-02 DIAGNOSIS — E538 Deficiency of other specified B group vitamins: Secondary | ICD-10-CM | POA: Diagnosis not present

## 2021-01-02 DIAGNOSIS — R739 Hyperglycemia, unspecified: Secondary | ICD-10-CM

## 2021-01-02 LAB — URINALYSIS, ROUTINE W REFLEX MICROSCOPIC
Bilirubin Urine: NEGATIVE
Ketones, ur: NEGATIVE
Leukocytes,Ua: NEGATIVE
Nitrite: NEGATIVE
Specific Gravity, Urine: 1.01 (ref 1.000–1.030)
Total Protein, Urine: NEGATIVE
Urine Glucose: NEGATIVE
Urobilinogen, UA: 0.2 (ref 0.0–1.0)
WBC, UA: NONE SEEN (ref 0–?)
pH: 6 (ref 5.0–8.0)

## 2021-01-02 LAB — LIPID PANEL
Cholesterol: 141 mg/dL (ref 0–200)
HDL: 48.5 mg/dL (ref >39.00)
LDL Cholesterol: 64 mg/dL (ref 0–99)
NonHDL: 92.08
Total CHOL/HDL Ratio: 3
Triglycerides: 139 mg/dL (ref 0.0–149.0)
VLDL: 27.8 mg/dL (ref 0.0–40.0)

## 2021-01-02 LAB — BASIC METABOLIC PANEL
BUN: 23 mg/dL (ref 6–23)
CO2: 27 mEq/L (ref 19–32)
Calcium: 9.4 mg/dL (ref 8.4–10.5)
Chloride: 95 mEq/L — ABNORMAL LOW (ref 96–112)
Creatinine, Ser: 1.54 mg/dL — ABNORMAL HIGH (ref 0.40–1.20)
GFR: 34.28 mL/min — ABNORMAL LOW (ref >60.00)
Glucose, Bld: 129 mg/dL — ABNORMAL HIGH (ref 70–99)
Potassium: 4.4 mEq/L (ref 3.5–5.1)
Sodium: 131 mEq/L — ABNORMAL LOW (ref 135–145)

## 2021-01-02 LAB — HEPATIC FUNCTION PANEL
ALT: 14 U/L (ref 0–35)
AST: 20 U/L (ref 0–37)
Albumin: 3.8 g/dL (ref 3.5–5.2)
Alkaline Phosphatase: 55 U/L (ref 39–117)
Bilirubin, Direct: 0.1 mg/dL (ref 0.0–0.3)
Total Bilirubin: 0.5 mg/dL (ref 0.2–1.2)
Total Protein: 7.2 g/dL (ref 6.0–8.3)

## 2021-01-02 LAB — CBC WITH DIFFERENTIAL/PLATELET
Basophils Absolute: 0.1 10*3/uL (ref 0.0–0.1)
Basophils Relative: 1.1 % (ref 0.0–3.0)
Eosinophils Absolute: 0.4 10*3/uL (ref 0.0–0.7)
Eosinophils Relative: 3.9 % (ref 0.0–5.0)
HCT: 38.4 % (ref 36.0–46.0)
Hemoglobin: 13 g/dL (ref 12.0–15.0)
Lymphocytes Relative: 31 % (ref 12.0–46.0)
Lymphs Abs: 2.8 10*3/uL (ref 0.7–4.0)
MCHC: 34 g/dL (ref 30.0–36.0)
MCV: 86.2 fl (ref 78.0–100.0)
Monocytes Absolute: 0.6 10*3/uL (ref 0.1–1.0)
Monocytes Relative: 6.6 % (ref 3.0–12.0)
Neutro Abs: 5.2 10*3/uL (ref 1.4–7.7)
Neutrophils Relative %: 57.4 % (ref 43.0–77.0)
Platelets: 241 10*3/uL (ref 150.0–400.0)
RBC: 4.46 Mil/uL (ref 3.87–5.11)
RDW: 14.6 % (ref 11.5–15.5)
WBC: 9.1 10*3/uL (ref 4.0–10.5)

## 2021-01-02 LAB — VITAMIN B12: Vitamin B-12: >1506 pg/mL — ABNORMAL HIGH (ref 211–911)

## 2021-01-02 LAB — HEMOGLOBIN A1C: Hgb A1c MFr Bld: 6 % (ref 4.6–6.5)

## 2021-01-02 LAB — TSH: TSH: 2 u[IU]/mL (ref 0.35–4.50)

## 2021-01-02 NOTE — Telephone Encounter (Signed)
Labs called in regards to a order put in for a psa level to be checked for the patient. Patient is a female and they wanted to check to see if this was an error however Dr. Jonny Ruiz is out of the office.

## 2021-01-05 ENCOUNTER — Ambulatory Visit: Payer: Commercial Managed Care - PPO | Admitting: Internal Medicine

## 2021-01-05 ENCOUNTER — Other Ambulatory Visit: Payer: Self-pay

## 2021-01-08 ENCOUNTER — Ambulatory Visit: Payer: Commercial Managed Care - PPO | Admitting: Internal Medicine

## 2021-01-08 ENCOUNTER — Encounter: Payer: Self-pay | Admitting: Internal Medicine

## 2021-01-08 ENCOUNTER — Other Ambulatory Visit: Payer: Self-pay

## 2021-01-08 VITALS — BP 138/86 | HR 96 | Temp 97.9°F | Ht 62.0 in | Wt 151.0 lb

## 2021-01-08 DIAGNOSIS — Z0001 Encounter for general adult medical examination with abnormal findings: Secondary | ICD-10-CM

## 2021-01-08 DIAGNOSIS — E559 Vitamin D deficiency, unspecified: Secondary | ICD-10-CM | POA: Diagnosis not present

## 2021-01-08 DIAGNOSIS — E538 Deficiency of other specified B group vitamins: Secondary | ICD-10-CM | POA: Diagnosis not present

## 2021-01-08 DIAGNOSIS — N1831 Chronic kidney disease, stage 3a: Secondary | ICD-10-CM

## 2021-01-08 DIAGNOSIS — F17200 Nicotine dependence, unspecified, uncomplicated: Secondary | ICD-10-CM

## 2021-01-08 DIAGNOSIS — I10 Essential (primary) hypertension: Secondary | ICD-10-CM

## 2021-01-08 DIAGNOSIS — E7800 Pure hypercholesterolemia, unspecified: Secondary | ICD-10-CM

## 2021-01-08 DIAGNOSIS — R739 Hyperglycemia, unspecified: Secondary | ICD-10-CM

## 2021-01-08 NOTE — Assessment & Plan Note (Signed)
BP Readings from Last 3 Encounters:  01/08/21 138/86  09/05/20 132/70  07/13/20 120/70   Stable, pt to continue medical treatment  - hct, zestril   Current Outpatient Medications (Cardiovascular):  .  atorvastatin (LIPITOR) 80 MG tablet, TAKE 1 TABLET DAILY (NEW DOSE. DISCONTINUE PREVIOUS PRESCRIPTION) .  hydrochlorothiazide (HYDRODIURIL) 12.5 MG tablet, TAKE ONE-HALF (1/2) TABLET DAILY .  lisinopril (ZESTRIL) 40 MG tablet, TAKE 1 TABLET DAILY   Current Outpatient Medications (Analgesics):  .  aspirin EC 81 MG tablet, Take 1 tablet (81 mg total) by mouth daily.

## 2021-01-08 NOTE — Patient Instructions (Signed)
Please continue all other medications as before, and refills have been done if requested.  Please have the pharmacy call with any other refills you may need.  Please continue your efforts at being more active, low cholesterol diet, and weight control.  You are otherwise up to date with prevention measures today.  Please keep your appointments with your specialists as you may have planned - kidney doctor in 6 months  Please call if you change your mind about the pulmonary referral for ythe LDCT (CT scan) program for smokers  Please quit smoking  Please make an Appointment to return for your 1 year visit, or sooner if needed, with Lab testing by Appointment as well, to be done about 3-5 days before at the FIRST FLOOR Lab (so this is for TWO appointments - please see the scheduling desk as you leave)  Due to the ongoing Covid 19 pandemic, our lab now requires an appointment for any labs done at our office.  If you need labs done and do not have an appointment, please call our office ahead of time to schedule before presenting to the lab for your testing.

## 2021-01-08 NOTE — Assessment & Plan Note (Signed)
Lab Results  Component Value Date   HGBA1C 6.0 01/02/2021   Stable, pt to continue current medical treatment  - diet and wt control

## 2021-01-08 NOTE — Assessment & Plan Note (Signed)
Lab Results  Component Value Date   LDLCALC 64 01/02/2021   Stable, pt to continue current statin lipitor 80

## 2021-01-08 NOTE — Assessment & Plan Note (Signed)
Age and sex appropriate education and counseling updated with regular exercise and diet Referrals for preventative services - declines LDCT referral,  Immunizations addressed - declines covid booster, or flu or pneumovax for now Smoking counseling  - none needed Evidence for depression or other mood disorder - none significant Most recent labs reviewed. I have personally reviewed and have noted: 1) the patient's medical and social history 2) [name] current medications and supplements 3) [name] height, weight, and BMI have been recorded in the chart

## 2021-01-08 NOTE — Assessment & Plan Note (Signed)
Lab Results  Component Value Date   CREATININE 1.54 (H) 01/02/2021   Stable overall, cont to avoid nephrotoxins

## 2021-01-08 NOTE — Assessment & Plan Note (Signed)
Counseled to quit, declines referral for LDCT program for now

## 2021-01-08 NOTE — Progress Notes (Signed)
Patient ID: Cindy Reid, female   DOB: 1951/06/12, 70 y.o.   MRN: [id number]         Chief Complaint:: wellness exam and smoking, ckd, htn, hyperglycemia, hld       HPI:  Cindy Reid is a 70 y.o. female here for wellness exam, declines covid booster for now, as well as declines referral LDCT program, and pneumovax  Or flu shot.                 Also Pt denies chest pain, increased sob or doe, wheezing, orthopnea, PND, increased LE swelling, palpitations, dizziness or syncope.   Pt denies polydipsia, polyuria, Denies new focal neuro s/s.   Pt denies fever, wt loss, night sweats, loss of appetite, or other constitutional symptoms   Wt Readings from Last 3 Encounters:  01/08/21 151 lb (68.5 kg)  09/05/20 150 lb 6.4 oz (68.2 kg)  07/13/20 153 lb (69.4 kg)   BP Readings from Last 3 Encounters:  01/08/21 138/86  09/05/20 132/70  07/13/20 120/70   Immunization History  Administered Date(s) Administered  . Influenza,inj,Quad PF,6+ Mos 12/07/2015  . PFIZER(Purple Top)SARS-COV-2 Vaccination 12/06/2019, 12/31/2019  . Tdap 12/07/2015  There are no preventive care reminders to display for this patient.    Past Medical History:  Diagnosis Date  . Carotid stenosis 07/28/2017   1-39% bilaterally 05/2015.  . Claudication in peripheral vascular disease (HCC) 07/13/2020  . Epilepsy (HCC)   . Hyperlipidemia   . Hypertension   . Migraine    Past Surgical History:  Procedure Laterality Date  . COLONOSCOPY    . MOUTH SURGERY    . TUBAL LIGATION      reports that she has been smoking cigarettes. She has been smoking about 2.00 packs per day. She has never used smokeless tobacco. She reports current alcohol use. She reports that she does not use drugs. family history includes Cancer in her father and mother; Colon cancer in her maternal aunt; Diabetes in her mother; Heart disease in her father. Allergies  Allergen Reactions  . Amlodipine     gingival hyperplasia  . Dilantin [Phenytoin]  Other (See Comments)    UNSURE OF REACTION   Current Outpatient Medications on File Prior to Visit  Medication Sig Dispense Refill  . aspirin EC 81 MG tablet Take 1 tablet (81 mg total) by mouth daily. 90 tablet 11  . atorvastatin (LIPITOR) 80 MG tablet TAKE 1 TABLET DAILY (NEW DOSE. DISCONTINUE PREVIOUS PRESCRIPTION) 90 tablet 3  . hydrochlorothiazide (HYDRODIURIL) 12.5 MG tablet TAKE ONE-HALF (1/2) TABLET DAILY 45 tablet 3  . lisinopril (ZESTRIL) 40 MG tablet TAKE 1 TABLET DAILY 90 tablet 3   No current facility-administered medications on file prior to visit.        ROS:  All others reviewed and negative.  Objective        PE:  BP 138/86   Pulse 96   Temp 97.9 F (36.6 C) (Oral)   Ht 5\' 2"  (1.575 m)   Wt 151 lb (68.5 kg)   SpO2 98%   BMI 27.62 kg/m                 Constitutional: Pt appears in NAD               HENT: Head: NCAT.                Right Ear: External ear normal.  Left Ear: External ear normal.                Eyes: . Pupils are equal, round, and reactive to light. Conjunctivae and EOM are normal               Nose: without d/c or deformity               Neck: Neck supple. Gross normal ROM               Cardiovascular: Normal rate and regular rhythm.                 Pulmonary/Chest: Effort normal and breath sounds without rales or wheezing.                Abd:  Soft, NT, ND, + BS, no organomegaly               Neurological: Pt is alert. At baseline orientation, motor grossly intact               Skin: Skin is warm. No rashes, no other new lesions, LE edema - none               Psychiatric: Pt behavior is normal without agitation   Micro: none  Cardiac tracings I have personally interpreted today:  none  Pertinent Radiological findings (summarize): none   Lab Results  Component Value Date   WBC 9.1 01/02/2021   HGB 13.0 01/02/2021   HCT 38.4 01/02/2021   PLT 241.0 01/02/2021   GLUCOSE 129 (H) 01/02/2021   CHOL 141 01/02/2021   TRIG  139.0 01/02/2021   HDL 48.50 01/02/2021   LDLDIRECT 95.0 06/22/2018   LDLCALC 64 01/02/2021   ALT 14 01/02/2021   AST 20 01/02/2021   NA 131 (L) 01/02/2021   K 4.4 01/02/2021   CL 95 (L) 01/02/2021   CREATININE 1.54 (H) 01/02/2021   BUN 23 01/02/2021   CO2 27 01/02/2021   TSH 2.00 01/02/2021   HGBA1C 6.0 01/02/2021   Assessment/Plan:  Cindy Reid is a 70 y.o. White or Caucasian [1] female with  has a past medical history of Carotid stenosis (07/28/2017), Claudication in peripheral vascular disease (HCC) (07/13/2020), Epilepsy (HCC), Hyperlipidemia, Hypertension, and Migraine.  Smoker Counseled to quit, declines referral for LDCT program for now  Encounter for well adult exam with abnormal findings Age and sex appropriate education and counseling updated with regular exercise and diet Referrals for preventative services - declines LDCT referral,  Immunizations addressed - declines covid booster, or flu or pneumovax for now Smoking counseling  - none needed Evidence for depression or other mood disorder - none significant Most recent labs reviewed. I have personally reviewed and have noted: 1) the patient's medical and social history 2) [name] current medications and supplements 3) [name] height, weight, and BMI have been recorded in the chart   CKD (chronic kidney disease) stage 3, GFR 30-59 ml/min (HCC) Lab Results  Component Value Date   CREATININE 1.54 (H) 01/02/2021   Stable overall, cont to avoid nephrotoxins  Essential hypertension BP Readings from Last 3 Encounters:  01/08/21 138/86  09/05/20 132/70  07/13/20 120/70   Stable, pt to continue medical treatment  - hct, zestril   Current Outpatient Medications (Cardiovascular):  .  atorvastatin (LIPITOR) 80 MG tablet, TAKE 1 TABLET DAILY (NEW DOSE. DISCONTINUE PREVIOUS PRESCRIPTION) .  hydrochlorothiazide (HYDRODIURIL) 12.5 MG tablet, TAKE ONE-HALF (1/2) TABLET DAILY .  lisinopril (ZESTRIL)  40  MG tablet, TAKE 1 TABLET DAILY   Current Outpatient Medications (Analgesics):  .  aspirin EC 81 MG tablet, Take 1 tablet (81 mg total) by mouth daily.     Hyperglycemia Lab Results  Component Value Date   HGBA1C 6.0 01/02/2021   Stable, pt to continue current medical treatment  - diet and wt control   Hyperlipidemia Lab Results  Component Value Date   LDLCALC 64 01/02/2021   Stable, pt to continue current statin lipitor 80   Followup: Return in about 1 year (around 01/08/2022).  Oliver Barre, MD 01/08/2021 10:18 PM [facility]

## 2021-04-10 ENCOUNTER — Ambulatory Visit: Payer: Commercial Managed Care - PPO | Admitting: Cardiovascular Disease

## 2021-05-01 ENCOUNTER — Other Ambulatory Visit: Payer: Self-pay

## 2021-05-01 ENCOUNTER — Ambulatory Visit: Payer: Commercial Managed Care - PPO | Admitting: Cardiovascular Disease

## 2021-05-01 ENCOUNTER — Encounter: Payer: Self-pay | Admitting: Cardiovascular Disease

## 2021-05-01 VITALS — BP 122/86 | Resp 18 | Ht 61.0 in | Wt 152.0 lb

## 2021-05-01 DIAGNOSIS — Z720 Tobacco use: Secondary | ICD-10-CM | POA: Diagnosis not present

## 2021-05-01 DIAGNOSIS — I10 Essential (primary) hypertension: Secondary | ICD-10-CM

## 2021-05-01 DIAGNOSIS — I739 Peripheral vascular disease, unspecified: Secondary | ICD-10-CM | POA: Diagnosis not present

## 2021-05-01 DIAGNOSIS — E785 Hyperlipidemia, unspecified: Secondary | ICD-10-CM | POA: Diagnosis not present

## 2021-05-01 NOTE — Patient Instructions (Signed)
Medication Instructions:  No changes *If you need a refill on your cardiac medications before your next appointment, please call your pharmacy*   Lab Work: None ordered If you have labs (blood work) drawn today and your tests are completely normal, you will receive your results only by: MyChart Message (if you have MyChart) OR A paper copy in the mail If you have any lab test that is abnormal or we need to change your treatment, we will call you to review the results.   Testing/Procedures: None ordered   Follow-Up: At [facility], you and your health needs are our priority.  As part of our continuing mission to provide you with exceptional heart care, we have created designated Provider Care Teams.  These Care Teams include your primary Cardiologist (physician) and Advanced Practice Providers (APPs -  Physician Assistants and Nurse Practitioners) who all work together to provide you with the care you need, when you need it.  We recommend signing up for the patient portal called "MyChart".  Sign up information is provided on this After Visit Summary.  MyChart is used to connect with patients for Virtual Visits (Telemedicine).  Patients are able to view lab/test results, encounter notes, upcoming appointments, etc.  Non-urgent messages can be sent to your provider as well.   To learn more about what you can do with MyChart, go to [email or web address].    Your next appointment:   12 month(s)  The format for your next appointment:   In Person  Provider:   Lorine Bears, MD  Steps to Quit Smoking Smoking tobacco is the leading cause of preventable death. It can affect almost every organ in the body. Smoking puts you and people around you at risk for many serious, long-lasting (chronic) diseases. Quitting smoking can be hard, but it is one of the best things thatyou can do for your health. It is never too late to quit. How do I get ready to quit? When you decide to quit smoking,  make a plan to help you succeed. Before you quit: Pick a date to quit. Set a date within the next 2 weeks to give you time to prepare. Write down the reasons why you are quitting. Keep this list in places where you will see it often. Tell your family, friends, and co-workers that you are quitting. Their support is important. Talk with your doctor about the choices that may help you quit. Find out if your health insurance will pay for these treatments. Know the people, places, things, and activities that make you want to smoke (triggers). Avoid them. What first steps can I take to quit smoking? Throw away all cigarettes at home, at work, and in your car. Throw away the things that you use when you smoke, such as ashtrays and lighters. Clean your car. Make sure to empty the ashtray. Clean your home, including curtains and carpets. What can I do to help me quit smoking? Talk with your doctor about taking medicines and seeing a counselor at the same time. You are more likely to succeed when you do both. If you are pregnant or breastfeeding, talk with your doctor about counseling or other ways to quit smoking. Do not take medicine to help you quit smoking unless your doctor tells you to do so. To quit smoking: Quit right away Quit smoking totally, instead of slowly cutting back on how much you smoke over a period of time. Go to counseling. You are more likely to quit if you go  to counseling sessions regularly. Take medicine You may take medicines to help you quit. Some medicines need a prescription, and some you can buy over-the-counter. Some medicines may contain a drug called nicotine to replace the nicotine in cigarettes. Medicines may: Help you to stop having the desire to smoke (cravings). Help to stop the problems that come when you stop smoking (withdrawal symptoms). Your doctor may ask you to use: Nicotine patches, gum, or lozenges. Nicotine inhalers or sprays. Non-nicotine medicine that  is taken by mouth. Find resources Find resources and other ways to help you quit smoking and remain smoke-free after you quit. These resources are most helpful when you use them often. They include: Online chats with a Veterinary surgeon. Phone quitlines. Printed Materials engineer. Support groups or group counseling. Text messaging programs. Mobile phone apps. Use apps on your mobile phone or tablet that can help you stick to your quit plan. There are many free apps for mobile phones and tablets as well as websites. Examples include Quit Guide from the [employer] and smokefree.gov  What things can I do to make it easier to quit?  Talk to your family and friends. Ask them to support and encourage you. Call a phone quitline (1-800-QUIT-NOW), reach out to support groups, or work with a Veterinary surgeon. Ask people who smoke to not smoke around you. Avoid places that make you want to smoke, such as: Bars. Parties. Smoke-break areas at work. Spend time with people who do not smoke. Lower the stress in your life. Stress can make you want to smoke. Try these things to help your stress: Getting regular exercise. Doing deep-breathing exercises. Doing yoga. Meditating. Doing a body scan. To do this, close your eyes, focus on one area of your body at a time from head to toe. Notice which parts of your body are tense. Try to relax the muscles in those areas. How will I feel when I quit smoking? Day 1 to 3 weeks Within the first 24 hours, you may start to have some problems that come from quitting tobacco. These problems are very bad 2-3 days after you quit, but they do not often last for more than 2-3 weeks. You may get these symptoms: Mood swings. Feeling restless, nervous, angry, or annoyed. Trouble concentrating. Dizziness. Strong desire for high-sugar foods and nicotine. Weight gain. Trouble pooping (constipation). Feeling like you may vomit (nausea). Coughing or a sore throat. Changes in how the medicines  that you take for other issues work in your body. Depression. Trouble sleeping (insomnia). Week 3 and afterward After the first 2-3 weeks of quitting, you may start to notice more positive results, such as: Better sense of smell and taste. Less coughing and sore throat. Slower heart rate. Lower blood pressure. Clearer skin. Better breathing. Fewer sick days. Quitting smoking can be hard. Do not give up if you fail the first time. Some people need to try a few times before they succeed. Do your best to stick to your quit plan, and talk with yourdoctor if you have any questions or concerns. Summary Smoking tobacco is the leading cause of preventable death. Quitting smoking can be hard, but it is one of the best things that you can do for your health. When you decide to quit smoking, make a plan to help you succeed. Quit smoking right away, not slowly over a period of time. When you start quitting, seek help from your doctor, family, or friends. This information is not intended to replace advice given to you  by your health care provider. Make sure you discuss any questions you have with your healthcare provider. Document Revised: 07/09/2019 Document Reviewed: 01/02/2019 Elsevier Patient Education  2022 [employer].

## 2021-05-01 NOTE — Progress Notes (Signed)
Cardiology Office Note   Date:  05/01/2021   ID:  Cindy Reid, DOB 1951/06/19, MRN [id number]  PCP:  Corwin Levins, MD  Cardiologist: Dr. Duke Salvia  No chief complaint on file.     History of Present Illness: Cindy Reid is a 70 y.o. female who is here today for a follow-up visit regarding peripheral arterial disease.   She has known history of essential hypertension, hyperlipidemia, mild carotid disease, extensive tobacco use and palpitations.  Vascular Doppler studies done in November 2021 showed an ABI of 1.05 on the right and 0.81 on the left.  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  No significant infrainguinal disease on the left side.  There was moderate right iliac disease and some SFA disease. Her claudication was not felt to be lifestyle limiting and thus I recommended medical therapy.  She is more limited by shortness of breath and claudication.  She has been doing reasonably well overall.  She still denies significant claudication.  Shortness of breath is unchanged and she denies chest pain.  She cut down on tobacco use.  She smokes now less than 1 pack/day.  Past Medical History:  Diagnosis Date   Carotid stenosis 07/28/2017   1-39% bilaterally 05/2015.   Claudication in peripheral vascular disease (HCC) 07/13/2020   Epilepsy (HCC)    Hyperlipidemia    Hypertension    Migraine     Past Surgical History:  Procedure Laterality Date   COLONOSCOPY     MOUTH SURGERY     TUBAL LIGATION       Current Outpatient Medications  Medication Sig Dispense Refill   aspirin EC 81 MG tablet Take 1 tablet (81 mg total) by mouth daily. 90 tablet 11   atorvastatin (LIPITOR) 80 MG tablet TAKE 1 TABLET DAILY (NEW DOSE. DISCONTINUE PREVIOUS PRESCRIPTION) 90 tablet 3   hydrochlorothiazide (HYDRODIURIL) 12.5 MG tablet TAKE ONE-HALF (1/2) TABLET DAILY 45 tablet 3   lisinopril (ZESTRIL) 40 MG tablet TAKE 1 TABLET DAILY 90 tablet 3   No current  facility-administered medications for this visit.    Allergies:   Amlodipine and Dilantin [phenytoin]    Social History:  The patient  reports that she has been smoking cigarettes. She has been smoking an average of 2.00 packs per day. She has never used smokeless tobacco. She reports current alcohol use. She reports that she does not use drugs.   Family History:  [name] family history includes Cancer in her father and mother; Colon cancer in her maternal aunt; Diabetes in her mother; Heart disease in her father.    ROS:  Please see the history of present illness.   Otherwise, review of systems are positive for none.   All other systems are reviewed and negative.    PHYSICAL EXAM: VS:  BP 122/86 (BP Location: Left Arm, Patient Position: Sitting, Cuff Size: Normal)   Resp 18   Ht 5\' 1"  (1.549 m)   Wt 152 lb (68.9 kg)   SpO2 94%   BMI 28.72 kg/m  , BMI Body mass index is 28.72 kg/m. GEN: Well nourished, well developed, in no acute distress  HEENT: normal  Neck: no JVD, carotid bruits, or masses Cardiac: RRR; no murmurs, rubs, or gallops,no edema  Respiratory:  clear to auscultation bilaterally, normal work of breathing GI: soft, nontender, nondistended, + BS MS: no deformity or atrophy  Skin: warm and dry, no rash Neuro:  Strength and sensation are intact Psych: euthymic mood,  full affect Vascular: femoral pulse: +2 on the right and +1 on the left, posterior tibial: +1 on the right and 0 on the left, dorsalis pedis: 0 bilaterally   EKG:  EKG is ordered today. EKG showed normal sinus rhythm with no significant ST or T wave changes.   Recent Labs: 01/02/2021: ALT 14; BUN 23; Creatinine, Ser 1.54; Hemoglobin 13.0; Platelets 241.0; Potassium 4.4; Sodium 131; TSH 2.00    Lipid Panel    Component Value Date/Time   CHOL 141 01/02/2021 0952   CHOL 143 08/30/2020 1457   TRIG 139.0 01/02/2021 0952   HDL 48.50 01/02/2021 0952   HDL 55 08/30/2020 1457   CHOLHDL 3 01/02/2021  0952   VLDL 27.8 01/02/2021 0952   LDLCALC 64 01/02/2021 0952   LDLCALC 65 08/30/2020 1457   LDLDIRECT 95.0 06/22/2018 1357      Wt Readings from Last 3 Encounters:  05/01/21 152 lb (68.9 kg)  01/08/21 151 lb (68.5 kg)  09/05/20 150 lb 6.4 oz (68.2 kg)       No flowsheet data found.    ASSESSMENT AND PLAN:  1.  Peripheral arterial disease: The patient has evidence of occluded left common iliac artery better claudication that overall mild and not lifestyle limiting.  No significant change in symptoms since her initial evaluation.  I asked her to stay as active as possible.  2.  Tobacco use: I again discussed with her the importance of smoking cessation and provided instructions .  3.  Hyperlipidemia: Continue treatment with atorvastatin.  Most recent lipid profile showed an LDL of 65.  4.  Essential hypertension: Blood pressure is well controlled on current medication.    Disposition:   FU with me in 6 months  Signed,  Lorine Bears, MD  05/01/2021 1:31 PM    [facility]

## 2021-09-06 ENCOUNTER — Other Ambulatory Visit: Payer: Self-pay | Admitting: Internal Medicine

## 2021-09-06 NOTE — Telephone Encounter (Signed)
Please refill as per office routine med refill policy (all routine meds to be refilled for 3 mo or monthly (per pt preference) up to one year from last visit, then month to month grace period for 3 mo, then further med refills will have to be denied) ? ?

## 2021-09-20 ENCOUNTER — Other Ambulatory Visit: Payer: Self-pay | Admitting: Internal Medicine

## 2021-09-20 NOTE — Telephone Encounter (Signed)
Please refill as per office routine med refill policy (all routine meds to be refilled for 3 mo or monthly (per pt preference) up to one year from last visit, then month to month grace period for 3 mo, then further med refills will have to be denied) ? ?

## 2021-12-05 ENCOUNTER — Other Ambulatory Visit: Payer: Self-pay | Admitting: Cardiovascular Disease

## 2021-12-05 NOTE — Telephone Encounter (Signed)
Rx(s) sent to pharmacy electronically.  

## 2022-01-03 ENCOUNTER — Other Ambulatory Visit (INDEPENDENT_AMBULATORY_CARE_PROVIDER_SITE_OTHER): Payer: Commercial Managed Care - PPO

## 2022-01-03 DIAGNOSIS — R739 Hyperglycemia, unspecified: Secondary | ICD-10-CM

## 2022-01-03 DIAGNOSIS — E559 Vitamin D deficiency, unspecified: Secondary | ICD-10-CM

## 2022-01-03 DIAGNOSIS — E7800 Pure hypercholesterolemia, unspecified: Secondary | ICD-10-CM

## 2022-01-03 DIAGNOSIS — E538 Deficiency of other specified B group vitamins: Secondary | ICD-10-CM

## 2022-01-03 LAB — BASIC METABOLIC PANEL
BUN: 18 mg/dL (ref 6–23)
CO2: 27 mEq/L (ref 19–32)
Calcium: 9.3 mg/dL (ref 8.4–10.5)
Chloride: 97 mEq/L (ref 96–112)
Creatinine, Ser: 1.5 mg/dL — ABNORMAL HIGH (ref 0.40–1.20)
GFR: 35.13 mL/min — ABNORMAL LOW (ref >60.00)
Glucose, Bld: 120 mg/dL — ABNORMAL HIGH (ref 70–99)
Potassium: 3.6 mEq/L (ref 3.5–5.1)
Sodium: 133 mEq/L — ABNORMAL LOW (ref 135–145)

## 2022-01-03 LAB — LIPID PANEL
Cholesterol: 140 mg/dL (ref 0–200)
HDL: 52.7 mg/dL (ref >39.00)
NonHDL: 87.18
Total CHOL/HDL Ratio: 3
Triglycerides: 230 mg/dL — ABNORMAL HIGH (ref 0.0–149.0)
VLDL: 46 mg/dL — ABNORMAL HIGH (ref 0.0–40.0)

## 2022-01-03 LAB — URINALYSIS, ROUTINE W REFLEX MICROSCOPIC
Bilirubin Urine: NEGATIVE
Ketones, ur: NEGATIVE
Leukocytes,Ua: NEGATIVE
Nitrite: NEGATIVE
Specific Gravity, Urine: <=1.005 — AB (ref 1.000–1.030)
Total Protein, Urine: NEGATIVE
Urine Glucose: NEGATIVE
Urobilinogen, UA: 0.2 (ref 0.0–1.0)
pH: 6.5 (ref 5.0–8.0)

## 2022-01-03 LAB — VITAMIN D 25 HYDROXY (VIT D DEFICIENCY, FRACTURES): VITD: 51.03 ng/mL (ref 30.00–100.00)

## 2022-01-03 LAB — CBC WITH DIFFERENTIAL/PLATELET
Basophils Absolute: 0.1 10*3/uL (ref 0.0–0.1)
Basophils Relative: 1.2 % (ref 0.0–3.0)
Eosinophils Absolute: 0.4 10*3/uL (ref 0.0–0.7)
Eosinophils Relative: 4.5 % (ref 0.0–5.0)
HCT: 44.3 % (ref 36.0–46.0)
Hemoglobin: 14.7 g/dL (ref 12.0–15.0)
Lymphocytes Relative: 26 % (ref 12.0–46.0)
Lymphs Abs: 2.4 10*3/uL (ref 0.7–4.0)
MCHC: 33.1 g/dL (ref 30.0–36.0)
MCV: 88.2 fl (ref 78.0–100.0)
Monocytes Absolute: 0.6 10*3/uL (ref 0.1–1.0)
Monocytes Relative: 6.3 % (ref 3.0–12.0)
Neutro Abs: 5.6 10*3/uL (ref 1.4–7.7)
Neutrophils Relative %: 62 % (ref 43.0–77.0)
Platelets: 274 10*3/uL (ref 150.0–400.0)
RBC: 5.02 Mil/uL (ref 3.87–5.11)
RDW: 13.7 % (ref 11.5–15.5)
WBC: 9.1 10*3/uL (ref 4.0–10.5)

## 2022-01-03 LAB — TSH: TSH: 1.45 u[IU]/mL (ref 0.35–5.50)

## 2022-01-03 LAB — LDL CHOLESTEROL, DIRECT: Direct LDL: 65 mg/dL

## 2022-01-03 LAB — HEPATIC FUNCTION PANEL
ALT: 14 U/L (ref 0–35)
AST: 20 U/L (ref 0–37)
Albumin: 4.1 g/dL (ref 3.5–5.2)
Alkaline Phosphatase: 58 U/L (ref 39–117)
Bilirubin, Direct: 0.1 mg/dL (ref 0.0–0.3)
Total Bilirubin: 0.4 mg/dL (ref 0.2–1.2)
Total Protein: 7.2 g/dL (ref 6.0–8.3)

## 2022-01-03 LAB — VITAMIN B12: Vitamin B-12: >1504 pg/mL — ABNORMAL HIGH (ref 211–911)

## 2022-01-03 LAB — HEMOGLOBIN A1C: Hgb A1c MFr Bld: 6.3 % (ref 4.6–6.5)

## 2022-01-10 ENCOUNTER — Encounter: Payer: Self-pay | Admitting: Internal Medicine

## 2022-01-10 ENCOUNTER — Other Ambulatory Visit: Payer: Self-pay

## 2022-01-10 ENCOUNTER — Ambulatory Visit: Payer: Commercial Managed Care - PPO | Admitting: Internal Medicine

## 2022-01-10 VITALS — BP 168/74 | HR 98 | Temp 98.1°F | Ht 61.0 in | Wt 151.4 lb

## 2022-01-10 DIAGNOSIS — I10 Essential (primary) hypertension: Secondary | ICD-10-CM | POA: Diagnosis not present

## 2022-01-10 DIAGNOSIS — E559 Vitamin D deficiency, unspecified: Secondary | ICD-10-CM | POA: Diagnosis not present

## 2022-01-10 DIAGNOSIS — E7800 Pure hypercholesterolemia, unspecified: Secondary | ICD-10-CM

## 2022-01-10 DIAGNOSIS — F17200 Nicotine dependence, unspecified, uncomplicated: Secondary | ICD-10-CM

## 2022-01-10 DIAGNOSIS — N1831 Chronic kidney disease, stage 3a: Secondary | ICD-10-CM

## 2022-01-10 DIAGNOSIS — Z0001 Encounter for general adult medical examination with abnormal findings: Secondary | ICD-10-CM

## 2022-01-10 DIAGNOSIS — E538 Deficiency of other specified B group vitamins: Secondary | ICD-10-CM | POA: Diagnosis not present

## 2022-01-10 DIAGNOSIS — R739 Hyperglycemia, unspecified: Secondary | ICD-10-CM

## 2022-01-10 NOTE — Progress Notes (Signed)
Patient ID: Cindy Reid, female   DOB: August 08, 1951, 71 y.o.   MRN: [id number] ? ? ? ?     Chief Complaint:: wellness exam and low b12, htn, ckd, smoking ? ?     HPI:  Cindy Reid is a 71 y.o. female here for wellness exam; declines shignrix, and pneumovax, o/w up to date ?         ?              Also takinng b12 regularly, Pt denies chest pain, increased sob or doe, wheezing, orthopnea, PND, increased LE swelling, palpitations, dizziness or syncope.   Pt denies polydipsia, polyuria, or new focal neuro s/s.    Pt denies fever, wt loss, night sweats, loss of appetite, or other constitutional symptoms  Still smoking, not ready to quit.  BP has been controlled per pt at renal appt recently, does not want change today.  No other new complaints ?  ?Wt Readings from Last 3 Encounters:  ?01/10/22 151 lb 6.4 oz (68.7 kg)  ?05/01/21 152 lb (68.9 kg)  ?01/08/21 151 lb (68.5 kg)  ? ?BP Readings from Last 3 Encounters:  ?01/10/22 (!) 168/74  ?05/01/21 122/86  ?01/08/21 138/86  ? ?Immunization History  ?Administered Date(s) Administered  ? Influenza,inj,Quad PF,6+ Mos 12/07/2015  ? PFIZER(Purple Top)SARS-COV-2 Vaccination 12/06/2019, 12/31/2019  ? Tdap 12/07/2015  ? ?There are no preventive care reminders to display for this patient. ? ?  ? ?Past Medical History:  ?Diagnosis Date  ? Carotid stenosis 07/28/2017  ? 1-39% bilaterally 05/2015.  ? Claudication in peripheral vascular disease ([place]) 07/13/2020  ? Epilepsy ([place])   ? Hyperlipidemia   ? Hypertension   ? Migraine   ? ?Past Surgical History:  ?Procedure Laterality Date  ? COLONOSCOPY    ? MOUTH SURGERY    ? TUBAL LIGATION    ? ? reports that she has been smoking cigarettes. She has been smoking an average of 2 packs per day. She has never used smokeless tobacco. She reports current alcohol use. She reports that she does not use drugs. ?family history includes Cancer in her father and mother; Colon cancer in her maternal aunt; Diabetes in her mother; Heart disease in her  father. ?Allergies  ?Allergen Reactions  ? Amlodipine   ?  gingival hyperplasia  ? Dilantin [Phenytoin] Other (See Comments)  ?  UNSURE OF REACTION  ? ?Current Outpatient Medications on File Prior to Visit  ?Medication Sig Dispense Refill  ? aspirin EC 81 MG tablet Take 1 tablet (81 mg total) by mouth daily. 90 tablet 11  ? atorvastatin (LIPITOR) 80 MG tablet Take 1 tablet (80 mg total) by mouth daily. 90 tablet 1  ? Calcium Carb-Cholecalciferol (CALCIUM 1000 + D PO) Take by mouth.    ? hydrochlorothiazide (HYDRODIURIL) 12.5 MG tablet TAKE ONE-HALF (1/2) TABLET DAILY 45 tablet 3  ? lisinopril (ZESTRIL) 40 MG tablet TAKE 1 TABLET DAILY 90 tablet 3  ? ?No current facility-administered medications on file prior to visit.  ? ?     ROS:  All others reviewed and negative. ? ?Objective  ? ?     PE:  BP (!) 168/74 (BP Location: Right Arm, Patient Position: Sitting, Cuff Size: Normal)   Pulse 98   Temp 98.1 ?F (36.7 ?C) (Oral)   Ht 5\' 1"  (1.549 m)   Wt 151 lb 6.4 oz (68.7 kg)   SpO2 93%   BMI 28.61 kg/m?  ? ?  Constitutional: Pt appears in NAD ?              HENT: Head: NCAT.  ?              Right Ear: External ear normal.   ?              Left Ear: External ear normal.  ?              Eyes: . Pupils are equal, round, and reactive to light. Conjunctivae and EOM are normal ?              Nose: without d/c or deformity ?              Neck: Neck supple. Gross normal ROM ?              Cardiovascular: Normal rate and regular rhythm.   ?              Pulmonary/Chest: Effort normal and breath sounds without rales or wheezing.  ?              Abd:  Soft, NT, ND, + BS, no organomegaly ?              Neurological: Pt is alert. At baseline orientation, motor grossly intact ?              Skin: Skin is warm. No rashes, no other new lesions, LE edema - none ?              Psychiatric: Pt behavior is normal without agitation  ? ?Micro: none ? ?Cardiac tracings I have personally interpreted today:  none ? ?Pertinent  Radiological findings (summarize): none  ? ?Lab Results  ?Component Value Date  ? WBC 9.1 01/03/2022  ? HGB 14.7 01/03/2022  ? HCT 44.3 01/03/2022  ? PLT 274.0 01/03/2022  ? GLUCOSE 120 (H) 01/03/2022  ? CHOL 140 01/03/2022  ? TRIG 230.0 (H) 01/03/2022  ? HDL 52.70 01/03/2022  ? LDLDIRECT 65.0 01/03/2022  ? [place] 64 01/02/2021  ? ALT 14 01/03/2022  ? AST 20 01/03/2022  ? NA 133 (L) 01/03/2022  ? K 3.6 01/03/2022  ? CL 97 01/03/2022  ? CREATININE 1.50 (H) 01/03/2022  ? BUN 18 01/03/2022  ? CO2 27 01/03/2022  ? TSH 1.45 01/03/2022  ? HGBA1C 6.3 01/03/2022  ? ?Assessment/Plan:  ?Cindy Reid is a 71 y.o. White or Caucasian [1] female with  has a past medical history of Carotid stenosis (07/28/2017), Claudication in peripheral vascular disease ([place]) (07/13/2020), Epilepsy ([place]), Hyperlipidemia, Hypertension, and Migraine. ? ?Encounter for well adult exam with abnormal findings ?Age and sex appropriate education and counseling updated with regular exercise and diet ?Referrals for preventative services - none needed ?Immunizations addressed - declines shingrix, pneumovax ?Smoking counseling  - pt counseled to quit, pt not ready ?Evidence for depression or other mood disorder - none significant ?Most recent labs reviewed. ?I have personally reviewed and have noted: ?1) the patient's medical and social history ?2) [name] current medications and supplements ?3) [name] height, weight, and BMI have been recorded in the chart ? ? ?Smoker ?Pt counsled to quit, pt not ready ? ?Hyperlipidemia ?Lab Results  ?Component Value Date  ? [place] 64 01/02/2021  ? ?Stable, pt to continue current statin lipitor 80 ? ? ?Hyperglycemia ?Lab Results  ?Component Value Date  ? HGBA1C 6.3 01/03/2022  ? ?Stable, pt to continue current medical treatment  -  diet ? ? ?Essential hypertension ?BP Readings from Last 3 Encounters:  ?01/10/22 (!) 168/74  ?05/01/21 122/86  ?01/08/21 138/86  ? ?Uncontrolled, pt states stable at home and  recent renal appt, ok to follow for now per pt preference, cont same tx - lisinopril , hct ? ?CKD (chronic kidney disease) stage 3, GFR 30-59 ml/min (HCC) ?Lab Results  ?Component Value Date  ? CREATININE 1.50 (H) 01/03/2022  ? ?Stable overall, cont to avoid nephrotoxins ? ? ?B12 deficiency ?Lab Results  ?Component Value Date  ? [id number] >1504 (H) 01/03/2022  ? ?overcontrolled, to d/c b12 for now and follow ? ?Followup: No follow-ups on file. ? ?Cathlean Cower, MD 01/15/2022 11:47 AM ?[facility] ?[place] ?Internal Medicine ?

## 2022-01-10 NOTE — Patient Instructions (Signed)
Ok to stop the b12 for now ? ?Please remember to take your lab testing to your kidney doctor appt ? ?Please stop smoking ? ?Please continue all other medications as before, and refills have been done if requested. ? ?Please have the pharmacy call with any other refills you may need. ? ?Please continue your efforts at being more active, low cholesterol diet, and weight control. ? ?You are otherwise up to date with prevention measures today. ? ?Please keep your appointments with your specialists as you may have planned ? ?Please make an Appointment to return for your 1 year visit, or sooner if needed, with Lab testing by Appointment as well, to be done about 3-5 days before at the FIRST FLOOR Lab (so this is for TWO appointments - please see the scheduling desk as you leave) ? ? ?Due to the ongoing Covid 19 pandemic, our lab now requires an appointment for any labs done at our office.  If you need labs done and do not have an appointment, please call our office ahead of time to schedule before presenting to the lab for your testing. ? ?

## 2022-01-15 DIAGNOSIS — E538 Deficiency of other specified B group vitamins: Secondary | ICD-10-CM | POA: Insufficient documentation

## 2022-01-15 NOTE — Assessment & Plan Note (Addendum)
Age and sex appropriate education and counseling updated with regular exercise and diet ?Referrals for preventative services - none needed ?Immunizations addressed - declines shingrix, pneumovax ?Smoking counseling  - pt counseled to quit, pt not ready ?Evidence for depression or other mood disorder - none significant ?Most recent labs reviewed. ?I have personally reviewed and have noted: ?1) the patient's medical and social history ?2) [name] current medications and supplements ?3) [name] height, weight, and BMI have been recorded in the chart ? ?

## 2022-01-15 NOTE — Assessment & Plan Note (Signed)
Pt counsled to quit, pt not ready °

## 2022-01-15 NOTE — Assessment & Plan Note (Signed)
Lab Results  Component Value Date   LDLCALC 64 01/02/2021   Stable, pt to continue current statin lipitor 80  

## 2022-01-15 NOTE — Assessment & Plan Note (Addendum)
BP Readings from Last 3 Encounters:  ?01/10/22 (!) 168/74  ?05/01/21 122/86  ?01/08/21 138/86  ? ?Uncontrolled, pt states stable at home and recent renal appt, ok to follow for now per pt preference, cont same tx - lisinopril , hct ?

## 2022-01-15 NOTE — Assessment & Plan Note (Signed)
Lab Results  ?Component Value Date  ? [id number] >1504 (H) 01/03/2022  ? ?overcontrolled, to d/c b12 for now and follow ?

## 2022-01-15 NOTE — Assessment & Plan Note (Signed)
Lab Results  ?Component Value Date  ? HGBA1C 6.3 01/03/2022  ? ?Stable, pt to continue current medical treatment  - diet ? ?

## 2022-01-15 NOTE — Assessment & Plan Note (Signed)
Lab Results  ?Component Value Date  ? CREATININE 1.50 (H) 01/03/2022  ? ?Stable overall, cont to avoid nephrotoxins ? ?

## 2022-01-15 NOTE — Addendum Note (Signed)
Addended by: Biagio Borg on: 01/15/2022 11:49 AM ? ? Modules accepted: Orders ? ?

## 2022-03-11 ENCOUNTER — Encounter (HOSPITAL_BASED_OUTPATIENT_CLINIC_OR_DEPARTMENT_OTHER): Payer: Self-pay | Admitting: Cardiovascular Disease

## 2022-03-11 ENCOUNTER — Ambulatory Visit (HOSPITAL_BASED_OUTPATIENT_CLINIC_OR_DEPARTMENT_OTHER): Payer: Commercial Managed Care - PPO | Admitting: Cardiovascular Disease

## 2022-03-11 VITALS — BP 126/64 | HR 100 | Ht 61.0 in | Wt 154.7 lb

## 2022-03-11 DIAGNOSIS — F17200 Nicotine dependence, unspecified, uncomplicated: Secondary | ICD-10-CM | POA: Diagnosis not present

## 2022-03-11 DIAGNOSIS — E7800 Pure hypercholesterolemia, unspecified: Secondary | ICD-10-CM

## 2022-03-11 DIAGNOSIS — I10 Essential (primary) hypertension: Secondary | ICD-10-CM

## 2022-03-11 DIAGNOSIS — I6523 Occlusion and stenosis of bilateral carotid arteries: Secondary | ICD-10-CM

## 2022-03-11 MED ORDER — BUPROPION HCL ER (SR) 150 MG PO TB12: ORAL_TABLET | ORAL | 1 refills | Status: DC

## 2022-03-11 MED ORDER — OZEMPIC (1 MG/DOSE) 4 MG/3ML ~~LOC~~ SOPN: PEN_INJECTOR | SUBCUTANEOUS | 3 refills | Status: DC

## 2022-03-11 MED ORDER — OZEMPIC (0.25 OR 0.5 MG/DOSE) 2 MG/1.5ML ~~LOC~~ SOPN: PEN_INJECTOR | SUBCUTANEOUS | 1 refills | Status: DC

## 2022-03-11 NOTE — Patient Instructions (Signed)
Medication Instructions:  ?START WELLBUTRIN 150 MG DAILY AND THEN INCREASE TO TWICE A DAY  ? ?START OZEMPIC  ?INJECT 0.25 MG WEEKLY FOR 4 WEEKS  ?INCREASE TO 0.5 MG WEEKLY FOR 6 WEEKS ?INCREASE TO 1 MG WEEKLY  ? ?*If you need a refill on your cardiac medications before your next appointment, please call your pharmacy* ? ?Lab Work: ?NONE ? ?Testing/Procedures: ?NONE ? ?Follow-Up: ?At [facility], you and your health needs are our priority.  As part of our continuing mission to provide you with exceptional heart care, we have created designated Provider Care Teams.  These Care Teams include your primary Cardiologist (physician) and Advanced Practice Providers (APPs -  Physician Assistants and Nurse Practitioners) who all work together to provide you with the care you need, when you need it. ? ?We recommend signing up for the patient portal called "MyChart".  Sign up information is provided on this After Visit Summary.  MyChart is used to connect with patients for Virtual Visits (Telemedicine).  Patients are able to view lab/test results, encounter notes, upcoming appointments, etc.  Non-urgent messages can be sent to your provider as well.   ?To learn more about what you can do with MyChart, go to [email or web address].   ? ?Your next appointment:   ?6 month(s) ? ?The format for your next appointment:   ?In Person ? ?Provider:   ?Chilton Si, MD  ? ? ? ? ? ?

## 2022-03-11 NOTE — Progress Notes (Unsigned)
? ? ?Cardiology Office Note ? ? ?Date:  03/11/2022  ? ?ID:  Cindy Reid, DOB 1951-05-14, MRN [id number] ? ?PCP:  Biagio Borg, MD  ?Cardiologist:   Skeet Latch, MD  ? ?No chief complaint on file. ? ? ?History of Present Illness: ?Cindy Reid is a 71 y.o. female with hypertension, hyperlipidemia, mild carotid stenosis, PAD, and palpitations who presents for follow up.  On 05/20/15 Cindy Reid was evaluated in the ED for a syncopal episode.  She was in the bathroom at [facility] with an upset stomach and diarrhea.  After using the bathroom and washing her hands, she was on her way out and the next thing she remembers she was on the floor.  There was no preceding chest pain, shortness of breath, palpitations, or warning. She has never had an episode like this in the past. She is brought to the ED for evaluation and was thought to have had an orthostatic hypotension episode.  Cindy Reid followed up with her primary care doctor who ordered an echocardiogram that was unremarkable aside from grade 1 diastolic dysfunction. A 48 hour Holter 05/2015 revealed sinus rhythm with rare PACs and PVCs.  She also had rare, short-lived, atrial runs that lasted for less than 2 seconds each.  She had a carotid ultrasound that showed mild bilateral ICA stenosis but no signficant obstruction.   ? ?At her last appointment she was feeling well.  She reported some claudication.  ABIs 08/2020 revealed greater than 50% stenosis in the proximal right common iliac artery, less than 50% stenosis in the mid and distal common iliac artery, and 50 to 75% stenosis in the mid SFA.  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  She was referred to Dr. Fletcher Reid who recommended medical therapy.  She last saw him 04/2021 and was stable.  We discussed smoking cessation but she was not interested at the time.  She was able to cut back to 1.5 packs/week.   ? ?Lately Cindy Reid has been feeling well physically.  She  has been very active with her cats and dogs but does not get any formal exercise.  She had successfully cut back on her smoking to a pack and half per week.  However she has been smoking more lately because she has been more stressed.  She has no exertional chest pain or shortness of breath.  She does feel little short of breath in the morning but after she coughs it gets better throughout the day.  When she coughs up is clear.  She has no fevers or chills.  She rarely checks her blood pressures at home.  She denies any claudication.  She is frustrated that she has difficulty losing weight despite the fact that she does not eat much. ? ? ? ?Past Medical History:  ?Diagnosis Date  ? Carotid stenosis 07/28/2017  ? 1-39% bilaterally 05/2015.  ? Claudication in peripheral vascular disease ([place]) 07/13/2020  ? Epilepsy ([place])   ? Hyperlipidemia   ? Hypertension   ? Migraine   ? ? ?Past Surgical History:  ?Procedure Laterality Date  ? COLONOSCOPY    ? MOUTH SURGERY    ? TUBAL LIGATION    ? ? ? ?Current Outpatient Medications  ?Medication Sig Dispense Refill  ? aspirin EC 81 MG tablet Take 1 tablet (81 mg total) by mouth daily. 90 tablet 11  ? atorvastatin (LIPITOR) 80 MG tablet Take 1 tablet (80 mg total)  by mouth daily. 90 tablet 1  ? Calcium Carb-Cholecalciferol (CALCIUM 1000 + D PO) Take by mouth.    ? hydrochlorothiazide (HYDRODIURIL) 12.5 MG tablet TAKE ONE-HALF (1/2) TABLET DAILY 45 tablet 3  ? lisinopril (ZESTRIL) 40 MG tablet TAKE 1 TABLET DAILY 90 tablet 3  ? ?No current facility-administered medications for this visit.  ? ? ?Allergies:   Amlodipine and Dilantin [phenytoin]  ? ? ?Social History:  The patient  reports that she has been smoking cigarettes. She has been smoking an average of 2 packs per day. She has never used smokeless tobacco. She reports current alcohol use. She reports that she does not use drugs.  ? ?Family History:  [name] family history includes Cancer in her father and mother; Colon cancer  in her maternal aunt; Diabetes in her mother; Heart disease in her father.  ? ? ?ROS:  Please see the history of present illness.  Otherwise, review of systems are positive for none.   All other systems are reviewed and negative.  ? ? ?PHYSICAL EXAM: ?VS:  BP 126/64   Pulse 100   Ht 5\' 1"  (1.549 m)   Wt 154 lb 11.2 oz (70.2 kg)   BMI 29.23 kg/m?  , BMI Body mass index is 29.23 kg/m?. ?GENERAL:  Well appearing ?HEENT: Pupils equal round and reactive, fundi not visualized, oral mucosa unremarkable ?NECK:  No jugular venous distention, waveform within normal limits, carotid upstroke brisk and symmetric, no bruits ?LUNGS:  Clear to auscultation bilaterally ?HEART:  RRR.  PMI not displaced or sustained,S1 and S2 within normal limits, no S3, no S4, no clicks, no rubs, no murmurs ?ABD:  Flat, positive bowel sounds normal in frequency in pitch, no bruits, no rebound, no guarding, no midline pulsatile mass, no hepatomegaly, no splenomegaly ?EXT:  1+ DP/PT bilaterally. No edema, no cyanosis no clubbing ?SKIN:  No rashes no nodules ?NEURO:  Cranial nerves II through XII grossly intact, motor grossly intact throughout ?PSYCH:  Cognitively intact, oriented to person place and time ? ? ?EKG:  EKG is ordered today. ?The ekg ordered today demonstrates sinus tachycardia at 104 bpm.   ?07/28/17: sinus tachycardia. Rate 103 bpm. Cannot rule out prior inferior infarct. ?07/28/2018: Sinus rhythm.  Rate 83 bpm. ?07/12/2019: Sinus rhythm.  Rate 85 bpm. ?07/13/2020: Sinus rhythm.  Rate 83 bpm.  Cannot rule out prior inferior infarct. ?03/11/22: Sinus tachycardia.  Rate 100 bpm.   ? ?Carotid ultrasound 06/22/15: 1-39% bilateral ICA stenosis ? ?TTE 06/20/15: LVEF 55-60%.  Moderate LVH.  Trivial AR.   ? ?ABI/Arterial Doppler 08/2020: ?Right: Heterogenous plaque throughout.  ?>50% stenosis in the proximal common iliac artery, low end range.  ?<50% stenosis in the mid and distal common iliac artery, high end range.  ?50-74% stenosis in the mid  SFA, low end range.  ?Three vessel run-off.  ? ?Left: Heterogenous plaque throughout.  ?Total occlusion noted in the proximal and mid common iliac artery.  ?No evidence of stenosis or occlusive disease in the lower extremity.  ?Three vessel run-off.  ? ?The supraceliac/proximal abdominal aorta is ectatic.  ?   ?Summary:  ?Right: Resting right ankle-brachial index is within normal range. No  ?evidence of significant right lower extremity arterial disease. The right  ?toe-brachial index is abnormal.  ? ?Left: Resting left ankle-brachial index indicates mild left lower  ?extremity arterial disease. The left toe-brachial index is abnormal.  ? ? ? ?Recent Labs: ?01/03/2022: ALT 14; BUN 18; Creatinine, Ser 1.50; Hemoglobin 14.7; Platelets 274.0; Potassium  3.6; Sodium 133; TSH 1.45  ? ? ?Lipid Panel ?   ?Component Value Date/Time  ? CHOL 140 01/03/2022 0856  ? CHOL 143 08/30/2020 1457  ? TRIG 230.0 (H) 01/03/2022 0856  ? HDL 52.70 01/03/2022 0856  ? HDL 55 08/30/2020 1457  ? CHOLHDL 3 01/03/2022 0856  ? VLDL 46.0 (H) 01/03/2022 0856  ? [place] 64 01/02/2021 0952  ? [place] 65 08/30/2020 1457  ? LDLDIRECT 65.0 01/03/2022 0856  ? ?  ? ?Wt Readings from Last 3 Encounters:  ?03/11/22 154 lb 11.2 oz (70.2 kg)  ?01/10/22 151 lb 6.4 oz (68.7 kg)  ?05/01/21 152 lb (68.9 kg)  ?  ? ? ?Other studies Reviewed: ?Additional studies/ records that were reviewed today include: medical record . ?Review of the above records demonstrates:  Please see elsewhere in the note.   ? ? ?ASSESSMENT AND PLAN: ? ?# Hypertension:  ?Blood pressure was above goal initially but normalized on repeat.  Continue HCTZ and lisinopril.   ?  ?# Hyperlipidemia: Lipids well-controlled.  Continue atorvastatin.   ? ?# Tobacco abuse: Ms. Brauner is interested in trying to quit.  She also struggles with anxiety.  Will start Wellbutrin.  Smoking cessation discussed for 5 minutes.  ? ?# Mild carotid stenosis: 1 to 39% ICA stenosis bilaterally 07/2017 and 06/2019.  Continue  aspirin and atorvastatin.   ? ?# Claudication:  ?# PAD:  ?Currently has no claudication.  She follows with Dr. Fletcher Reid.  Lipids well-controlled as above.  Continue aspirin and atorvastaitn.   ? ?Current medi

## 2022-04-17 ENCOUNTER — Ambulatory Visit (INDEPENDENT_AMBULATORY_CARE_PROVIDER_SITE_OTHER): Payer: Commercial Managed Care - PPO

## 2022-04-17 ENCOUNTER — Other Ambulatory Visit: Payer: Self-pay

## 2022-04-17 ENCOUNTER — Ambulatory Visit
Admission: RE | Admit: 2022-04-17 | Discharge: 2022-04-17 | Disposition: A | Discharge status: Home / Self Care | Payer: Commercial Managed Care - PPO | Source: Ambulatory Visit | Attending: Internal Medicine | Admitting: Internal Medicine

## 2022-04-17 VITALS — BP 103/65 | HR 98 | Temp 98.1°F | Resp 20

## 2022-04-17 DIAGNOSIS — J209 Acute bronchitis, unspecified: Secondary | ICD-10-CM | POA: Diagnosis not present

## 2022-04-17 DIAGNOSIS — R0602 Shortness of breath: Secondary | ICD-10-CM | POA: Diagnosis not present

## 2022-04-17 MED ORDER — ALBUTEROL SULFATE HFA 108 (90 BASE) MCG/ACT IN AERS: 1.0000 | INHALATION_SPRAY | Freq: Four times a day (QID) | RESPIRATORY_TRACT | 0 refills | Status: DC | PRN

## 2022-04-17 MED ORDER — BENZONATATE 100 MG PO CAPS: 100.0000 mg | ORAL_CAPSULE | Freq: Three times a day (TID) | ORAL | 0 refills | Status: DC | PRN

## 2022-04-17 MED ORDER — FLUTICASONE PROPIONATE 50 MCG/ACT NA SUSP: 1.0000 | Freq: Every day | NASAL | 0 refills | Status: DC

## 2022-04-17 NOTE — ED Triage Notes (Signed)
Cough for 3-4 days.  Patient has had a fever of 100.5.  patient feels clammy and sweaty.  Patient can sometimes get phlegm out other times it is a dry cough

## 2022-04-17 NOTE — ED Provider Notes (Signed)
[facility]    CSN: [id number] Arrival date & time: 04/17/22  1445      History   Chief Complaint Chief Complaint  Patient presents with   Cough    Entered by patient    HPI Cindy Reid is a 71 y.o. female.   Patient presents with cough that has been present for about 3 to 4 days.  Denies any known sick contacts.  Tmax at home was 100.5 today.  Denies any upper respiratory symptoms, sore throat, ear pain, nausea, vomiting, diarrhea, abdominal pain.  Denies chest pain but does endorse some intermittent shortness of breath with exertion.  Denies history of COPD or asthma but patient does smoke cigarettes daily.  Cough is dry at times and productive at times.  Patient has taken over-the-counter cough medication with minimal improvement.   Cough   Past Medical History:  Diagnosis Date   Carotid stenosis 07/28/2017   1-39% bilaterally 05/2015.   Claudication in peripheral vascular disease ([place]) 07/13/2020   Epilepsy ([place])    Hyperlipidemia    Hypertension    Migraine     Patient Active Problem List   Diagnosis Date Noted   B12 deficiency 01/15/2022   Smoker 01/08/2021   Claudication in peripheral vascular disease ([place]) 07/13/2020   CKD (chronic kidney disease) stage 3, GFR 30-59 ml/min ([place]) 07/06/2018   Sinusitis chronic, frontal 07/06/2018   Carotid stenosis 07/28/2017   Paresthesias/numbness 12/25/2016   Raynaud phenomenon 12/25/2016   Right groin pain 12/25/2016   Hyperlipidemia 06/03/2015   Migraine    Epilepsy ([place])    Encounter for well adult exam with abnormal findings 06/01/2015   Essential hypertension 06/01/2015   Syncope 06/01/2015   Palpitations 06/01/2015   Hyperglycemia 06/01/2015    Past Surgical History:  Procedure Laterality Date   COLONOSCOPY     MOUTH SURGERY     TUBAL LIGATION      OB History   No obstetric history on file.      Home Medications    Prior to Admission medications   Medication Sig Start Date End  Date Taking? Authorizing Provider  albuterol (VENTOLIN HFA) 108 (90 Base) MCG/ACT inhaler Inhale 1-2 puffs into the lungs every 6 (six) hours as needed for wheezing or shortness of breath. 04/17/22  Yes Teodora Medici, FNP  aspirin EC 81 MG tablet Take 1 tablet (81 mg total) by mouth daily. 12/07/15   Biagio Borg, MD  atorvastatin (LIPITOR) 80 MG tablet Take 1 tablet (80 mg total) by mouth daily. 12/05/21   Skeet Latch, MD  benzonatate (TESSALON) 100 MG capsule Take 1 capsule (100 mg total) by mouth every 8 (eight) hours as needed for cough. 04/17/22  Yes Aspin Palomarez, Hildred Alamin E, FNP  buPROPion (WELLBUTRIN SR) 150 MG 12 hr tablet TAKE 1 TABLET DAILY FOR 3 DAYS THEN INCREASE TO TWICE A DAY FOR 12 WEEKS 03/11/22   Skeet Latch, MD  Calcium Carb-Cholecalciferol (CALCIUM 1000 + D PO) Take by mouth.    [provider]  fluticasone (FLONASE) 50 MCG/ACT nasal spray Place 1 spray into both nostrils daily for 3 days. 04/17/22 04/20/22 Yes Jozey Janco, Michele Rockers, FNP  hydrochlorothiazide (HYDRODIURIL) 12.5 MG tablet TAKE ONE-HALF (1/2) TABLET DAILY 09/06/21   Biagio Borg, MD  lisinopril (ZESTRIL) 40 MG tablet TAKE 1 TABLET DAILY 09/24/21   Biagio Borg, MD  Semaglutide, 1 MG/DOSE, (OZEMPIC, 1 MG/DOSE,) 4 MG/3ML SOPN AFTER YOU FINISH FIRST 10 WEEKS INJECT 1 MG WEEKLY Patient not  taking: Reported on 04/17/2022 03/11/22   Chilton Si, MD  Semaglutide,0.25 or 0.5MG /DOS, (OZEMPIC, 0.25 OR 0.5 MG/DOSE,) 2 MG/1.5ML SOPN INJECT 0.25 MG WEEKLY FOR 4 WEEKS THEN INCREASE TO 0.5 MG WEEKLY FOR 6 WEEKS Patient not taking: Reported on 04/17/2022 03/11/22   Chilton Si, MD    Family History Family History  Problem Relation Age of Onset   Cancer Mother        Breast   Diabetes Mother    Cancer Father        Lung   Heart disease Father    Colon cancer Maternal Aunt     Social History Social History   Tobacco Use   Smoking status: Every Day    Packs/day: 2.00    Types: Cigarettes   Smokeless tobacco:  Never   Tobacco comments:    3 packs per day   Vaping Use   Vaping Use: Never used  Substance Use Topics   Alcohol use: Yes    Alcohol/week: 0.0 standard drinks of alcohol    Comment: occasional   Drug use: No     Allergies   Amlodipine and Dilantin [phenytoin]   Review of Systems Review of Systems Per HPI  Physical Exam Triage Vital Signs ED Triage Vitals  Enc Vitals Group     BP 04/17/22 1500 103/65     Pulse Rate 04/17/22 1500 98     Resp 04/17/22 1500 20     Temp 04/17/22 1500 98.1 F (36.7 C)     Temp Source 04/17/22 1500 Oral     SpO2 04/17/22 1500 100 %     Weight --      Height --      Head Circumference --      Peak Flow --      Pain Score 04/17/22 1456 4     Pain Loc --      Pain Edu? --      Excl. in GC? --    No data found.  Updated Vital Signs BP 103/65 (BP Location: Left Arm)   Pulse 98   Temp 98.1 F (36.7 C) (Oral)   Resp 20   SpO2 100%   Visual Acuity Right Eye Distance:   Left Eye Distance:   Bilateral Distance:    Right Eye Near:   Left Eye Near:    Bilateral Near:     Physical Exam Constitutional:      General: She is not in acute distress.    Appearance: Normal appearance. She is not toxic-appearing or diaphoretic.  HENT:     Head: Normocephalic and atraumatic.     Right Ear: Tympanic membrane and ear canal normal.     Left Ear: Tympanic membrane and ear canal normal.     Nose: Congestion present.     Mouth/Throat:     Mouth: Mucous membranes are moist.     Pharynx: No posterior oropharyngeal erythema.  Eyes:     Extraocular Movements: Extraocular movements intact.     Conjunctiva/sclera: Conjunctivae normal.     Pupils: Pupils are equal, round, and reactive to light.  Cardiovascular:     Rate and Rhythm: Normal rate and regular rhythm.     Pulses: Normal pulses.     Heart sounds: Normal heart sounds.  Pulmonary:     Effort: Pulmonary effort is normal. No respiratory distress.     Breath sounds: No stridor. No  wheezing, rhonchi or rales.  Abdominal:     General: Abdomen is flat.  Bowel sounds are normal.     Palpations: Abdomen is soft.  Musculoskeletal:        General: Normal range of motion.     Cervical back: Normal range of motion.  Skin:    General: Skin is warm and dry.  Neurological:     General: No focal deficit present.     Mental Status: She is alert and oriented to person, place, and time. Mental status is at baseline.  Psychiatric:        Mood and Affect: Mood normal.        Behavior: Behavior normal.      UC Treatments / Results  Labs (all labs ordered are listed, but only abnormal results are displayed) Labs Reviewed  NOVEL CORONAVIRUS, NAA    EKG   Radiology DG Chest 2 View  Result Date: 04/17/2022 CLINICAL DATA:  Shortness of breath EXAM: CHEST - 2 VIEW COMPARISON:  05/10/2015 FINDINGS: Mild bronchitic changes. No consolidation, pleural effusion or pneumothorax. Normal cardiomediastinal silhouette with aortic atherosclerosis. IMPRESSION: Mild bronchitic changes without acute focal airspace disease. Electronically Signed   By: Donavan Foil M.D.   On: 04/17/2022 15:37    Procedures Procedures (including critical care time)  Medications Ordered in UC Medications - No data to display  Initial Impression / Assessment and Plan / UC Course  I have reviewed the triage vital signs and the nursing notes.  Pertinent labs & imaging results that were available during my care of the patient were reviewed by me and considered in my medical decision making (see chart for details).     Patient presents with symptoms likely from a viral upper respiratory infection. Differential includes bacterial pneumonia, sinusitis, allergic rhinitis, COVID-19, flu. Patient is nontoxic appearing and not in need of emergent medical intervention.  COVID test pending.  Chest x-ray showing signs of acute bronchitis.  No signs of community-acquired pneumonia or bacterial infection.  Will avoid  antibiotics due to this.  Unable to prescribe prednisone given patient's history of seizure and risk of lowering seizure threshold.  Will treat with albuterol, Flonase, benzonatate.  Discussed supportive care with patient.  No signs of respiratory stress and oxygen is normal so do not think that emergent evaluation in the hospital is necessary at this time.  Recommended symptom control with over the counter medications as well.  Return if symptoms fail to improve. Patient states understanding and is agreeable.  Discharged with PCP followup.  Final Clinical Impressions(s) / UC Diagnoses   Final diagnoses:  Acute bronchitis, unspecified organism     Discharge Instructions      Your chest x-ray is showing signs of bronchitis.  You have been prescribed a few medications to alleviate symptoms including albuterol inhaler to take as needed for shortness of breath.  Please follow-up if symptoms persist or worsen.     ED Prescriptions     Medication Sig Dispense Auth. Provider   albuterol (VENTOLIN HFA) 108 (90 Base) MCG/ACT inhaler Inhale 1-2 puffs into the lungs every 6 (six) hours as needed for wheezing or shortness of breath. 1 each [place], [place] E, [place]   benzonatate (TESSALON) 100 MG capsule Take 1 capsule (100 mg total) by mouth every 8 (eight) hours as needed for cough. 21 capsule [place], [place] E, [place]   fluticasone [facility]) 50 MCG/ACT nasal spray Place 1 spray into both nostrils daily for 3 days. 16 g Teodora Medici, [place]      PDMP not reviewed this encounter.   [place], [place]  E, FNP 04/17/22 1549

## 2022-04-17 NOTE — Discharge Instructions (Signed)
Your chest x-ray is showing signs of bronchitis.  You have been prescribed a few medications to alleviate symptoms including albuterol inhaler to take as needed for shortness of breath.  Please follow-up if symptoms persist or worsen.

## 2022-04-18 LAB — NOVEL CORONAVIRUS, NAA: SARS-CoV-2, NAA: NOT DETECTED

## 2022-04-22 ENCOUNTER — Encounter (HOSPITAL_BASED_OUTPATIENT_CLINIC_OR_DEPARTMENT_OTHER): Payer: Self-pay | Admitting: Cardiovascular Disease

## 2022-06-03 ENCOUNTER — Other Ambulatory Visit: Payer: Self-pay | Admitting: Cardiovascular Disease

## 2022-06-03 NOTE — Telephone Encounter (Signed)
Rx(s) sent to pharmacy electronically.  

## 2022-08-27 ENCOUNTER — Ambulatory Visit: Payer: Commercial Managed Care - PPO | Attending: Cardiovascular Disease | Admitting: Cardiovascular Disease

## 2022-08-27 ENCOUNTER — Encounter: Payer: Self-pay | Admitting: Cardiovascular Disease

## 2022-08-27 VITALS — BP 122/90 | HR 92 | Ht 62.0 in | Wt 151.8 lb

## 2022-08-27 DIAGNOSIS — I10 Essential (primary) hypertension: Secondary | ICD-10-CM

## 2022-08-27 DIAGNOSIS — E7800 Pure hypercholesterolemia, unspecified: Secondary | ICD-10-CM | POA: Diagnosis not present

## 2022-08-27 DIAGNOSIS — I739 Peripheral vascular disease, unspecified: Secondary | ICD-10-CM | POA: Diagnosis not present

## 2022-08-27 DIAGNOSIS — Z720 Tobacco use: Secondary | ICD-10-CM

## 2022-08-27 NOTE — Progress Notes (Signed)
Cardiology Office Note   Date:  08/27/2022   ID:  Cindy Reid, DOB 10-28-1951, MRN [id number]  PCP:  Corwin Levins, MD  Cardiologist: Dr. Duke Salvia  Chief Complaint  Patient presents with   Follow-up       History of Present Illness: Cindy Reid is a 71 y.o. female who is here today for a follow-up visit regarding peripheral arterial disease.   She has known history of essential hypertension, hyperlipidemia, mild carotid disease, extensive tobacco use and palpitations.  Vascular Doppler studies done in November 2021 showed an ABI of 1.05 on the right and 0.81 on the left.  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  No significant infrainguinal disease on the left side.  There was moderate right iliac disease and some SFA disease. Her claudication was not felt to be lifestyle limiting and thus I recommended medical therapy.  She is more limited by shortness of breath than claudication.  She has been doing reasonably well with no chest pain or worsening dyspnea.  No significant lower extremity claudication.  Past Medical History:  Diagnosis Date   Carotid stenosis 07/28/2017   1-39% bilaterally 05/2015.   Claudication in peripheral vascular disease ([facility]) 07/13/2020   Epilepsy ([facility])    Hyperlipidemia    Hypertension    Migraine     Past Surgical History:  Procedure Laterality Date   COLONOSCOPY     MOUTH SURGERY     TUBAL LIGATION       Current Outpatient Medications  Medication Sig Dispense Refill   albuterol (VENTOLIN HFA) 108 (90 Base) MCG/ACT inhaler Inhale 1-2 puffs into the lungs every 6 (six) hours as needed for wheezing or shortness of breath. 1 each 0   aspirin EC 81 MG tablet Take 1 tablet (81 mg total) by mouth daily. 90 tablet 11   atorvastatin (LIPITOR) 80 MG tablet TAKE 1 TABLET DAILY 90 tablet 2   benzonatate (TESSALON) 100 MG capsule Take 1 capsule (100 mg total) by mouth every 8 (eight) hours as needed for cough. 21  capsule 0   buPROPion (WELLBUTRIN SR) 150 MG 12 hr tablet TAKE 1 TABLET DAILY FOR 3 DAYS THEN INCREASE TO TWICE A DAY FOR 12 WEEKS 180 tablet 1   Calcium Carb-Cholecalciferol (CALCIUM 1000 + D PO) Take by mouth.     fluticasone (FLONASE) 50 MCG/ACT nasal spray Place 1 spray into both nostrils daily for 3 days. 16 g 0   hydrochlorothiazide (HYDRODIURIL) 12.5 MG tablet TAKE ONE-HALF (1/2) TABLET DAILY 45 tablet 3   lisinopril (ZESTRIL) 40 MG tablet TAKE 1 TABLET DAILY 90 tablet 3   amoxicillin (AMOXIL) 500 MG tablet Take 500 mg by mouth 3 (three) times daily. (Patient not taking: Reported on 08/27/2022)     Semaglutide, 1 MG/DOSE, (OZEMPIC, 1 MG/DOSE,) 4 MG/3ML SOPN AFTER YOU FINISH FIRST 10 WEEKS INJECT 1 MG WEEKLY (Patient not taking: Reported on 08/27/2022) 3 mL 3   Semaglutide,0.25 or 0.5MG /DOS, (OZEMPIC, 0.25 OR 0.5 MG/DOSE,) 2 MG/1.5ML SOPN INJECT 0.25 MG WEEKLY FOR 4 WEEKS THEN INCREASE TO 0.5 MG WEEKLY FOR 6 WEEKS (Patient not taking: Reported on 08/27/2022) 1 mL 1   No current facility-administered medications for this visit.    Allergies:   Amlodipine and Dilantin [phenytoin]    Social History:  The patient  reports that she has been smoking cigarettes. She has been smoking an average of 2 packs per day. She has never used smokeless tobacco. She reports current  alcohol use. She reports that she does not use drugs.   Family History:  [name] family history includes Cancer in her father and mother; Colon cancer in her maternal aunt; Diabetes in her mother; Heart disease in her father.    ROS:  Please see the history of present illness.   Otherwise, review of systems are positive for none.   All other systems are reviewed and negative.    PHYSICAL EXAM: VS:  BP (!) 122/90 (BP Location: Left Arm, Patient Position: Sitting, Cuff Size: Normal)   Pulse 92   Ht 5\' 2"  (1.575 m)   Wt 151 lb 12.8 oz (68.9 kg)   SpO2 99%   BMI 27.76 kg/m  , BMI Body mass index is 27.76 kg/m. GEN:  Well nourished, well developed, in no acute distress  HEENT: normal  Neck: no JVD, carotid bruits, or masses Cardiac: RRR; no murmurs, rubs, or gallops,no edema  Respiratory:  clear to auscultation bilaterally, normal work of breathing GI: soft, nontender, nondistended, + BS MS: no deformity or atrophy  Skin: warm and dry, no rash Neuro:  Strength and sensation are intact Psych: euthymic mood, full affect Vascular: femoral pulse: +2 on the right and +1 on the left, posterior tibial: +1 on the right and 0 on the left, dorsalis pedis: 0 bilaterally   EKG:  EKG is ordered today. EKG showed normal sinus rhythm with no significant ST or T wave changes.   Recent Labs: 01/03/2022: ALT 14; BUN 18; Creatinine, Ser 1.50; Hemoglobin 14.7; Platelets 274.0; Potassium 3.6; Sodium 133; TSH 1.45    Lipid Panel    Component Value Date/Time   CHOL 140 01/03/2022 0856   CHOL 143 08/30/2020 1457   TRIG 230.0 (H) 01/03/2022 0856   HDL 52.70 01/03/2022 0856   HDL 55 08/30/2020 1457   CHOLHDL 3 01/03/2022 0856   VLDL 46.0 (H) 01/03/2022 0856   LDLCALC 64 01/02/2021 0952   LDLCALC 65 08/30/2020 1457   LDLDIRECT 65.0 01/03/2022 0856      Wt Readings from Last 3 Encounters:  08/27/22 151 lb 12.8 oz (68.9 kg)  03/11/22 154 lb 11.2 oz (70.2 kg)  01/10/22 151 lb 6.4 oz (68.7 kg)           No data to display            ASSESSMENT AND PLAN:  1.  Peripheral arterial disease: The patient has evidence of occluded left common iliac artery better claudication that overall mild and not lifestyle limiting.  No significant change in symptoms since her initial evaluation.  Continue medical therapy.  2.  Tobacco use: I again discussed with her the importance of smoking cessation.  3.  Hyperlipidemia: Continue treatment with atorvastatin.  Most recent lipid profile showed an LDL of 65.  4.  Essential hypertension: Blood pressure is well controlled on current medication.    Disposition:   FU  with me in 12 months  Signed,  Kathlyn Sacramento, MD  08/27/2022 1:24 PM    [facility]

## 2022-08-27 NOTE — Patient Instructions (Signed)
Medication Instructions:  No changes *If you need a refill on your cardiac medications before your next appointment, please call your pharmacy*   Lab Work: None ordered If you have labs (blood work) drawn today and your tests are completely normal, you will receive your results only by: MyChart Message (if you have MyChart) OR A paper copy in the mail If you have any lab test that is abnormal or we need to change your treatment, we will call you to review the results.   Testing/Procedures: None ordered   Follow-Up: At [facility], you and your health needs are our priority.  As part of our continuing mission to provide you with exceptional heart care, we have created designated Provider Care Teams.  These Care Teams include your primary Cardiologist (physician) and Advanced Practice Providers (APPs -  Physician Assistants and Nurse Practitioners) who all work together to provide you with the care you need, when you need it.  We recommend signing up for the patient portal called "MyChart".  Sign up information is provided on this After Visit Summary.  MyChart is used to connect with patients for Virtual Visits (Telemedicine).  Patients are able to view lab/test results, encounter notes, upcoming appointments, etc.  Non-urgent messages can be sent to your provider as well.   To learn more about what you can do with MyChart, go to [email or web address].    Your next appointment:   12 month(s)  The format for your next appointment:   In Person  Provider:   Dr. Arida  Important Information About Sugar       

## 2022-08-30 ENCOUNTER — Other Ambulatory Visit: Payer: Self-pay | Admitting: Internal Medicine

## 2022-08-30 NOTE — Telephone Encounter (Signed)
Please refill as per office routine med refill policy (all routine meds to be refilled for 3 mo or monthly (per pt preference) up to one year from last visit, then month to month grace period for 3 mo, then further med refills will have to be denied) ? ?

## 2022-09-13 ENCOUNTER — Other Ambulatory Visit: Payer: Self-pay | Admitting: Internal Medicine

## 2022-09-13 NOTE — Telephone Encounter (Signed)
,  Please refill as per office routine med refill policy (all routine meds to be refilled for 3 mo or monthly (per pt preference) up to one year from last visit, then month to month grace period for 3 mo, then further med refills will have to be denied)  

## 2022-09-23 ENCOUNTER — Encounter (HOSPITAL_BASED_OUTPATIENT_CLINIC_OR_DEPARTMENT_OTHER): Payer: Self-pay | Admitting: Cardiovascular Disease

## 2022-09-23 ENCOUNTER — Ambulatory Visit (HOSPITAL_BASED_OUTPATIENT_CLINIC_OR_DEPARTMENT_OTHER): Payer: Commercial Managed Care - PPO | Admitting: Cardiovascular Disease

## 2022-09-23 VITALS — BP 136/84 | HR 101 | Ht 62.0 in | Wt 151.7 lb

## 2022-09-23 DIAGNOSIS — I10 Essential (primary) hypertension: Secondary | ICD-10-CM | POA: Diagnosis not present

## 2022-09-23 DIAGNOSIS — I739 Peripheral vascular disease, unspecified: Secondary | ICD-10-CM | POA: Diagnosis not present

## 2022-09-23 DIAGNOSIS — E7800 Pure hypercholesterolemia, unspecified: Secondary | ICD-10-CM | POA: Diagnosis not present

## 2022-09-23 DIAGNOSIS — I6523 Occlusion and stenosis of bilateral carotid arteries: Secondary | ICD-10-CM

## 2022-09-23 DIAGNOSIS — F17200 Nicotine dependence, unspecified, uncomplicated: Secondary | ICD-10-CM

## 2022-09-23 NOTE — Assessment & Plan Note (Signed)
Blood pressure is elevated both initially and on repeat.  It did improve on repeat but was still above her goal of less than 130/80.  She does not check it regularly at home.  She will start checking it twice daily and bring both her log and machine to follow-up.  For now, continue HCTZ and lisinopril.

## 2022-09-23 NOTE — Assessment & Plan Note (Signed)
She continues to smoke 1.5 packs of cigarettes daily.  She did not try Wellbutrin due to her epilepsy history.  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  She is not interested in seeing a counselor.  Continue to reiterate the importance of smoking cessation related to her cardiovascular disease.

## 2022-09-23 NOTE — Assessment & Plan Note (Signed)
PAD.  She follows with Dr. Kirke Corin.  She has no claudication symptoms.  We are repeating her carotid Dopplers as above.  Continue aspirin and atorvastatin.  LDL goal less than 70.  Encouraged her to keep walking regularly.

## 2022-09-23 NOTE — Assessment & Plan Note (Signed)
Mild bilateral carotid stenosis.  Repeat carotid Doppler.  Continue aspirin and atorvastatin.  LDL goal is <70.

## 2022-09-23 NOTE — Progress Notes (Signed)
Cardiology Office Note   Date:  09/23/2022   ID:  Cindy Reid, DOB [birth year], MRN [id number]  PCP:  Corwin Levins, MD  Cardiologist:  Chilton Si, MD No chief complaint on file.  History of Present Illness: Cindy Reid is a 71 y.o. female with hypertension, hyperlipidemia, mild carotid stenosis, PAD, and palpitations who presents for follow up.  On 05/20/15 [name] was evaluated in the ED for a syncopal episode.  She was in the bathroom at [facility] with an upset stomach and diarrhea.  After using the bathroom and washing her hands, she was on her way out and the next thing she remembers she was on the floor.  There was no preceding chest pain, shortness of breath, palpitations, or warning. She has never had an episode like this in the past. She is brought to the ED for evaluation and was thought to have had an orthostatic hypotension episode.  [name] followed up with her primary care doctor who ordered an echocardiogram that was unremarkable aside from grade 1 diastolic dysfunction. A 48 hour Holter 05/2015 revealed sinus rhythm with rare PACs and PVCs.  She also had rare, short-lived, atrial runs that lasted for less than 2 seconds each.  She had a carotid ultrasound that showed mild bilateral ICA stenosis but no signficant obstruction.    She reported some claudication. ABIs 08/2020 revealed greater than 50% stenosis in the proximal right common iliac artery, less than 50% stenosis in the mid and distal common iliac artery, and 50 to 75% stenosis in the mid SFA.  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  She was referred to Dr. Kirke Corin who recommended medical therapy.  She last saw him 07/2022 and was stable.  We discussed smoking cessation but she was not interested at the time.  She was able to cut back to 1.5 packs/week.    Today, she has been doing well. In regards to her smoking habits, she is still smoking and doing 1.5  packs/week. She noted that she has not experienced any breathing issues. She is not consistent with checking her blood pressure at home. She has been mostly eating soups since she can't eat solid foods due to dental issues. She has been consistent with physical activity which is doing exercises like walking her dog outside.   Past Medical History:  Diagnosis Date   Carotid stenosis 07/28/2017   1-39% bilaterally 05/2015.   Claudication in peripheral vascular disease (HCC) 07/13/2020   Epilepsy (HCC)    Hyperlipidemia    Hypertension    Migraine     Past Surgical History:  Procedure Laterality Date   COLONOSCOPY     MOUTH SURGERY     TUBAL LIGATION       Current Outpatient Medications  Medication Sig Dispense Refill   albuterol (VENTOLIN HFA) 108 (90 Base) MCG/ACT inhaler Inhale 1-2 puffs into the lungs every 6 (six) hours as needed for wheezing or shortness of breath. 1 each 0   aspirin EC 81 MG tablet Take 1 tablet (81 mg total) by mouth daily. 90 tablet 11   atorvastatin (LIPITOR) 80 MG tablet TAKE 1 TABLET DAILY 90 tablet 2   Calcium Carb-Cholecalciferol (CALCIUM 1000 + D PO) Take by mouth.     hydrochlorothiazide (HYDRODIURIL) 12.5 MG tablet TAKE ONE-HALF (1/2) TABLET DAILY 45 tablet 3   lisinopril (ZESTRIL) 40 MG tablet TAKE 1 TABLET DAILY 90 tablet 3  No current facility-administered medications for this visit.    Allergies:   Amlodipine and Dilantin [phenytoin]    Social History:  The patient  reports that she has been smoking cigarettes. She has been smoking an average of 2 packs per day. She has never used smokeless tobacco. She reports current alcohol use. She reports that she does not use drugs.   Family History:  [name] family history includes Cancer in her father and mother; Colon cancer in her maternal aunt; Diabetes in her mother; Heart disease in her father.    ROS:  Please see the history of present illness.  Otherwise, review of systems are positive for  none.   All other systems are reviewed and negative.   PHYSICAL EXAM: VS:  BP 136/84 (BP Location: Right Arm, Patient Position: Sitting, Cuff Size: Normal)   Pulse (!) 101   Ht 5\' 2"  (1.575 m)   Wt 151 lb 11.2 oz (68.8 kg)   BMI 27.75 kg/m  , BMI Body mass index is 27.75 kg/m. GENERAL:  Well appearing HEENT: Pupils equal round and reactive, fundi not visualized, oral mucosa unremarkable NECK:  No jugular venous distention, waveform within normal limits, carotid upstroke brisk and symmetric, no bruits LUNGS:  Clear to auscultation bilaterally HEART:  RRR.  PMI not displaced or sustained,S1 and S2 within normal limits, no S3, no S4, no clicks, no rubs, no murmurs ABD:  Flat, positive bowel sounds normal in frequency in pitch, no bruits, no rebound, no guarding, no midline pulsatile mass, no hepatomegaly, no splenomegaly EXT:  1+ DP/PT bilaterally. No edema, no cyanosis no clubbing SKIN:  No rashes no nodules NEURO:  Cranial nerves II through XII grossly intact, motor grossly intact throughout PSYCH:  Cognitively intact, oriented to person place and time  EKG:  EKG is ordered today. 09/23/2022: Sinus tachycardia . Rate 101 bpm. The ekg ordered today demonstrates sinus tachycardia at 104 bpm.   07/28/17: sinus tachycardia. Rate 103 bpm. Cannot rule out prior inferior infarct. 07/28/2018: Sinus rhythm.  Rate 83 bpm. 07/12/2019: Sinus rhythm.  Rate 85 bpm. 07/13/2020: Sinus rhythm.  Rate 83 bpm.  Cannot rule out prior inferior infarct. 03/11/22: Sinus tachycardia.  Rate 100 bpm.    Carotid ultrasound 06/22/15: 1-39% bilateral ICA stenosis  TTE 06/20/15: LVEF 55-60%.  Moderate LVH.  Trivial AR.    ABI/Arterial Doppler 08/2020: Right: Heterogenous plaque throughout.  >50% stenosis in the proximal common iliac artery, low end range.  <50% stenosis in the mid and distal common iliac artery, high end range.  50-74% stenosis in the mid SFA, low end range.  Three vessel run-off.   Left:  Heterogenous plaque throughout.  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  No evidence of stenosis or occlusive disease in the lower extremity.  Three vessel run-off.   The supraceliac/proximal abdominal aorta is ectatic.     Summary:  Right: Resting right ankle-brachial index is within normal range. No  evidence of significant right lower extremity arterial disease. The right  toe-brachial index is abnormal.   Left: Resting left ankle-brachial index indicates mild left lower  extremity arterial disease. The left toe-brachial index is abnormal.     Recent Labs: 01/03/2022: ALT 14; BUN 18; Creatinine, Ser 1.50; Hemoglobin 14.7; Platelets 274.0; Potassium 3.6; Sodium 133; TSH 1.45   Lipid Panel    Component Value Date/Time   CHOL 140 01/03/2022 0856   CHOL 143 08/30/2020 1457   TRIG 230.0 (H) 01/03/2022 [id number]  HDL 52.70 01/03/2022 0856   HDL 55 08/30/2020 1457   CHOLHDL 3 01/03/2022 0856   VLDL 46.0 (H) 01/03/2022 0856   LDLCALC 64 01/02/2021 0952   LDLCALC 65 08/30/2020 1457   LDLDIRECT 65.0 01/03/2022 0856      Wt Readings from Last 3 Encounters:  09/23/22 151 lb 11.2 oz (68.8 kg)  08/27/22 151 lb 12.8 oz (68.9 kg)  03/11/22 154 lb 11.2 oz (70.2 kg)     Other studies Reviewed: Additional studies/ records that were reviewed today include: medical record . Review of the above records demonstrates:  Please see elsewhere in the note.     ASSESSMENT AND PLAN:  Carotid stenosis Mild bilateral carotid stenosis.  Repeat carotid Doppler.  Continue aspirin and atorvastatin.  LDL goal is <70.    Essential hypertension Blood pressure is elevated both initially and on repeat.  It did improve on repeat but was still above her goal of less than 130/80.  She does not check it regularly at home.  She will start checking it twice daily and bring both her log and machine to follow-up.  For now, continue HCTZ and lisinopril.  Hyperlipidemia Lipids have been  controlled on atorvastatin.  Her LDL goal is less than 70.  Claudication in peripheral vascular disease (HCC) PAD.  She follows with Dr. Fletcher Anon.  She has no claudication symptoms.  We are repeating her carotid Dopplers as above.  Continue aspirin and atorvastatin.  LDL goal less than 70.  Encouraged her to keep walking regularly.  Smoker She continues to smoke 1.5 packs of cigarettes daily.  She did not try Wellbutrin due to her epilepsy history.  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  She is not interested in seeing a counselor.  Continue to reiterate the importance of smoking cessation related to her cardiovascular disease.   Current medicines are reviewed at length with the patient today.  The patient does not have concerns regarding medicines.  The following changes have been made: none  Labs/ tests ordered today include:   Orders Placed This Encounter  Procedures   AMB Referral to [facility] Pharm-D   EKG 12-Lead   VAS US CAROTID     Disposition:   FU with Dr. Jonelle Sidle C. Taliaferro in 67-months.   I,Danny Valdes,acting as a Education administrator for [place], MD.,have documented all relevant documentation on the behalf of Skeet Latch, MD,as directed by  Skeet Latch, MD while in the presence of Skeet Latch, MD.  I, [place] Oval Linsey, MD have reviewed all documentation for this visit.  The documentation of the exam, diagnosis, procedures, and orders on 09/23/2022 are all accurate and complete.   Signed, Skeet Latch, MD  09/23/2022 1:09 PM    [place]

## 2022-09-23 NOTE — Assessment & Plan Note (Signed)
Lipids have been controlled on atorvastatin.  Her LDL goal is less than 70.

## 2022-09-23 NOTE — Patient Instructions (Signed)
Medication Instructions:  Your physician recommends that you continue on your current medications as directed. Please refer to the Current Medication list given to you today.   *If you need a refill on your cardiac medications before your next appointment, please call your pharmacy*  Lab Work: NONE  Testing/Procedures: Your physician has requested that you have a carotid duplex. This test is an ultrasound of the carotid arteries in your neck. It looks at blood flow through these arteries that supply the brain with blood. Allow one hour for this exam. There are no restrictions or special instructions.  Follow-Up: At [facility], you and your health needs are our priority.  As part of our continuing mission to provide you with exceptional heart care, we have created designated Provider Care Teams.  These Care Teams include your primary Cardiologist (physician) and Advanced Practice Providers (APPs -  Physician Assistants and Nurse Practitioners) who all work together to provide you with the care you need, when you need it.  We recommend signing up for the patient portal called "MyChart".  Sign up information is provided on this After Visit Summary.  MyChart is used to connect with patients for Virtual Visits (Telemedicine).  Patients are able to view lab/test results, encounter notes, upcoming appointments, etc.  Non-urgent messages can be sent to your provider as well.   To learn more about what you can do with MyChart, go to [email or web address].    Your next appointment:   6 month(s)  The format for your next appointment:   In Person  Provider:   Chilton Si, MD    1 MONTH WITH PHARM D  Other Instructions  MONITOR AND LOG YOUR BLOOD PRESSURE TWICE A DAY. BRING MACHINE AND LOG TO FOLLOW UP IN 1 MONTH

## 2022-10-03 ENCOUNTER — Ambulatory Visit (INDEPENDENT_AMBULATORY_CARE_PROVIDER_SITE_OTHER): Payer: Commercial Managed Care - PPO

## 2022-10-03 DIAGNOSIS — I6523 Occlusion and stenosis of bilateral carotid arteries: Secondary | ICD-10-CM

## 2022-10-09 ENCOUNTER — Other Ambulatory Visit (HOSPITAL_BASED_OUTPATIENT_CLINIC_OR_DEPARTMENT_OTHER): Payer: Self-pay | Admitting: *Deleted

## 2022-10-09 DIAGNOSIS — I6523 Occlusion and stenosis of bilateral carotid arteries: Secondary | ICD-10-CM

## 2022-10-29 ENCOUNTER — Ambulatory Visit: Payer: Commercial Managed Care - PPO | Attending: Student | Admitting: Student

## 2022-10-29 ENCOUNTER — Encounter: Payer: Self-pay | Admitting: Student

## 2022-10-29 VITALS — BP 148/83 | HR 90

## 2022-10-29 DIAGNOSIS — I10 Essential (primary) hypertension: Secondary | ICD-10-CM | POA: Diagnosis not present

## 2022-10-29 NOTE — Assessment & Plan Note (Addendum)
Assessment: BP is uncontrolled goal <130/80 1st reading in office BP 152/89 with heart rate 90 and 2nd reading 148/83 mHg  Forget to take BP mediations sometime  Tolerates current BP mediations well without any side effects  Denies SOB, palpitation, chest pain, headaches,or swelling Patient reports having white coat syndrome  Home cuff is inaccurate it reads around 10-15 points lower than office readings Reiterated the importance of adherence to medications, regular exercise and low salt diet  Given white coat syndrome not changing any mediation will wait to see how home BP trending on new home cuff in 4 weeks  In future can consider or increase the dose of thiazide ( per last lab in March CrCl >30) or add alpha blocker  Plan:  Patient to get new arm cuff and continue checking BP at home daily and report at the next visit  Bring new home cuff for validation at the next OV Continue taking lisinopril 40 mg daily and HCTZ 6.25 mg daily

## 2022-10-29 NOTE — Progress Notes (Signed)
Patient ID: Cindy Reid                 DOB: 1951-09-29                      MRN: [id number]      HPI: Cindy Reid is a 72 y.o. female referred by Dr. Oval Linsey  to HTN clinic. PMH is significant for carotid stenosis, hypertension, CKD stage-3, HDL.     Today patient came in for home BP cuff validation. It turn out to be inaccurate. Patient states her BP runs high at most office visits. She tolerates her BP well without any side effects,some time forget to take medication. Home readings ~120/80 with heart rate ~70. Patient is on liquid diet due to some dental issues. Patient says she smokes too many cigarette in one day. Should cut back on number of cigarette. Due to hip arthritis other than being active around the house she does not do any regular exercise   Home cuff validation  Date SBP/DBP  HR  1st reading  on home monitor  132/94 95  2nd reading on home monitor  139/91 95  In office 1st reading  152/89 90   In office 2nd reading  148/83 90   Social History:  Smoking: 1.5 pack per day  Alcohol: sometime not a lot  Diet: low salt,  On lot of soups lately due to some dental problems.  Drink: soda 4-5 per day   Exercise: none due to arthritis in hip.   HYPERTENSION CONTROL Vitals:   10/29/22 1655 10/29/22 1656  BP: (!) 152/89 (!) 148/83    [name] blood pressure is elevated above target today. {Click here if intervention needs to be changed Refresh Note :1}  In order to address the patient's elevated BP: The blood pressure is usually elevated in clinic.  Blood pressures monitored at home have been optimal.; Blood pressure will be monitored at home to determine if medication changes need to be made.;follow up with PharmD  is scheduled      Home BP readings: ~ 125/80    Wt Readings from Last 3 Encounters:  09/23/22 151 lb 11.2 oz (68.8 kg)  08/27/22 151 lb 12.8 oz (68.9 kg)  03/11/22 154 lb 11.2 oz (70.2 kg)   BP Readings from Last 3 Encounters:  10/29/22  (!) 148/83  09/23/22 136/84  08/27/22 (!) 122/90   Pulse Readings from Last 3 Encounters:  10/29/22 90  09/23/22 (!) 101  08/27/22 92    Renal function: CrCl cannot be calculated (Patient's most recent lab result is older than the maximum 21 days allowed.).  Past Medical History:  Diagnosis Date   Carotid stenosis 07/28/2017   1-39% bilaterally 05/2015.   Claudication in peripheral vascular disease ([place]) 07/13/2020   Epilepsy ([place])    Hyperlipidemia    Hypertension    Migraine     Current Outpatient Medications on File Prior to Visit  Medication Sig Dispense Refill   albuterol (VENTOLIN HFA) 108 (90 Base) MCG/ACT inhaler Inhale 1-2 puffs into the lungs every 6 (six) hours as needed for wheezing or shortness of breath. 1 each 0   aspirin EC 81 MG tablet Take 1 tablet (81 mg total) by mouth daily. 90 tablet 11   atorvastatin (LIPITOR) 80 MG tablet TAKE 1 TABLET DAILY 90 tablet 2   Calcium Carb-Cholecalciferol (CALCIUM 1000 + D PO) Take by mouth.     hydrochlorothiazide (HYDRODIURIL) 12.5 MG tablet  TAKE ONE-HALF (1/2) TABLET DAILY 45 tablet 3   lisinopril (ZESTRIL) 40 MG tablet TAKE 1 TABLET DAILY 90 tablet 3   No current facility-administered medications on file prior to visit.    Allergies  Allergen Reactions   Amlodipine     gingival hyperplasia   Dilantin [Phenytoin] Other (See Comments)    UNSURE OF REACTION    Blood pressure (!) 148/83, pulse 90.   Assessment/Plan:  1. Hypertension -  Essential hypertension Assessment: BP is uncontrolled goal <130/80 1st reading in office BP 152/89 with heart rate 90 and 2nd reading 148/83 mHg  Forget to take BP mediations sometime  Tolerates current BP mediations well without any side effects  Denies SOB, palpitation, chest pain, headaches,or swelling Patient reports having white coat syndrome  Home cuff is inaccurate it reads around 10-15 points lower than office readings Reiterated the importance of adherence to  medications, regular exercise and low salt diet  Given white coat syndrome not changing any mediation will wait to see how home BP trending on new home cuff in 4 weeks  In future can consider or increase the dose of thiazide ( per last lab in March CrCl >30) or add alpha blocker  Plan:  Patient to get new arm cuff and continue checking BP at home daily and report at the next visit  Bring new home cuff for validation at the next OV Continue taking lisinopril 40 mg daily and HCTZ 6.25 mg daily   Thank you  Cammy Copa, Pharm.D [facility] of [facility] [address], [place], [place] [address]  Phone: [phone]; Fax: [phone]

## 2022-10-29 NOTE — Patient Instructions (Addendum)
Changes made by your pharmacist Cammy Copa, PharmD at today's visit:    Instructions/Changes  (what do you need to do) Your Notes  (what you did and when you did it)  Buy new BP monitor and check BP every other day 2 hours after taking BP medications in the morning    2.  continue taking lisinopril 40 mg (1 tablet) and hydrochlorothiazide 12.5 mg (1/2 tablet) every day    Bring all of your meds, your BP cuff and your record of home blood pressures to your next appointment.    HOW TO TAKE YOUR BLOOD PRESSURE AT HOME  Rest 5 minutes before taking your blood pressure.  Don't smoke or drink caffeinated beverages for at least 30 minutes before. Take your blood pressure before (not after) you eat. Sit comfortably with your back supported and both feet on the floor (don't cross your legs). Elevate your arm to heart level on a table or a desk. Use the proper sized cuff. It should fit smoothly and snugly around your bare upper arm. There should be enough room to slip a fingertip under the cuff. The bottom edge of the cuff should be 1 inch above the crease of the elbow. Ideally, take 3 measurements at one sitting and record the average.  Important lifestyle changes to control high blood pressure  Intervention  Effect on the BP  Lose extra pounds and watch your waistline Weight loss is one of the most effective lifestyle changes for controlling blood pressure. If you're overweight or obese, losing even a small amount of weight can help reduce blood pressure. Blood pressure might go down by about 1 millimeter of mercury (mm Hg) with each kilogram (about 2.2 pounds) of weight lost.  Exercise regularly As a general goal, aim for at least 30 minutes of moderate physical activity every day. Regular physical activity can lower high blood pressure by about 5 to 8 mm Hg.  Eat a healthy diet Eating a diet rich in whole grains, fruits, vegetables, and low-fat dairy products and low in saturated fat and  cholesterol. A healthy diet can lower high blood pressure by up to 11 mm Hg.  Reduce salt (sodium) in your diet Even a small reduction of sodium in the diet can improve heart health and reduce high blood pressure by about 5 to 6 mm Hg.  Limit alcohol One drink equals 12 ounces of beer, 5 ounces of wine, or 1.5 ounces of 80-proof liquor.  Limiting alcohol to less than one drink a day for women or two drinks a day for men can help lower blood pressure by about 4 mm Hg.   If you have any questions or concerns please use My Chart to send questions or call the office at [phone]

## 2022-11-29 ENCOUNTER — Ambulatory Visit: Payer: Commercial Managed Care - PPO | Attending: Cardiovascular Disease | Admitting: Student

## 2022-11-29 ENCOUNTER — Encounter: Payer: Self-pay | Admitting: Student

## 2022-11-29 VITALS — BP 125/89 | HR 95

## 2022-11-29 DIAGNOSIS — I10 Essential (primary) hypertension: Secondary | ICD-10-CM

## 2022-11-29 NOTE — Progress Notes (Signed)
Patient ID: Cindy Reid                 DOB: [birth year]                      MRN: [id number]      HPI: Cindy Reid is a 72 y.o. female referred by Dr. Oval Linsey  to HTN clinic. PMH is significant for carotid stenosis, hypertension, CKD stage-3, HDL.     Today patient came in for new home BP cuff validation. It turn out to be accurate. Patient states her BP runs high at most office visits. She tolerates her BP well without any side effects. Home readings ~120/80-85 with heart rate ~80-85. Patient is on liquid diet due to some dental issues. She smokes a lot but not ready to quit yet. She remember to take her medications every day now, adherence has improved over the last month by using pill box.  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  Past Medical History:  Diagnosis Date   Carotid stenosis 07/28/2017   1-39% bilaterally 05/2015.   Claudication in peripheral vascular disease ([place]) 07/13/2020   Epilepsy ([place])    Hyperlipidemia    Hypertension    Migraine     Current  Outpatient Medications on File Prior to Visit  Medication Sig Dispense Refill   albuterol (VENTOLIN HFA) 108 (90 Base) MCG/ACT inhaler Inhale 1-2 puffs into the lungs every 6 (six) hours as needed for wheezing or shortness of breath. 1 each 0   aspirin EC 81 MG tablet Take 1 tablet (81 mg total) by mouth daily. 90 tablet 11   atorvastatin (LIPITOR) 80 MG tablet TAKE 1 TABLET DAILY 90 tablet 2   Calcium Carb-Cholecalciferol (CALCIUM 1000 + D PO) Take by mouth.     hydrochlorothiazide (HYDRODIURIL) 12.5 MG tablet TAKE ONE-HALF (1/2) TABLET DAILY 45 tablet 3   lisinopril (ZESTRIL) 40 MG tablet TAKE 1 TABLET DAILY 90 tablet 3   No current facility-administered medications on file prior to visit.    Allergies  Allergen Reactions   Amlodipine     gingival hyperplasia   Dilantin [Phenytoin] Other (See Comments)    UNSURE OF REACTION    Blood pressure (!) 148/83, pulse 90  Hypertension  Assessment/Plan: BP is uncontrolled goal <130/80 1st reading in office BP 114/75 with heart  rate 95 and 2nd reading 125/89 mHg  Takes current BP medications regularly and tolerates them well  without any side effects Denies SOB, palpitation, chest pain, headaches,or swelling [employer] home cuff is accurate for systolic blood pressure (SBP) but slightly off for diastolic blood pressure (DBP)  Reiterated the importance of adherence to medications, regular exercise and low salt diet  No medications changes today patient to keep close eye on BP at home if start staying above the goal (>130/80) persistently she need to reach out to the office  Continue taking lisinopril 40 mg daily and HCTZ 6.25 mg daily In future can consider - increase the dose of thiazide ( per last lab in March CrCl >30) or add alpha blocker    Thank you,  Cammy Copa, Pharm.D [facility] A Division of [facility] [address] [place], [place] [address]  Phone: [phone]; Fax: [phone]

## 2022-11-29 NOTE — Patient Instructions (Addendum)
No changes made by your pharmacist Cammy Copa, PharmD at today's visit:    Instructions/Changes  (what do you need to do) Your Notes  (what you did and when you did it)  Continue taking current BP medications    2.   Cut down on eating out and eat more home cooked meals with low salt   3. Keep checking BP at home at least 3-4 times per week and if started staying above the goal >130/80 range persistently can call us       [place] 5 minutes before taking your blood pressure.  Don't smoke or drink caffeinated beverages for at least 30 minutes before. Take your blood pressure before (not after) you eat. Sit comfortably with your back supported and both feet on the floor (don't cross your legs). Elevate your arm to heart level on a table or a desk. Use the proper sized cuff. It should fit smoothly and snugly around your bare upper arm. There should be enough room to slip a fingertip under the cuff. The bottom edge of the cuff should be 1 inch above the crease of the elbow. Ideally, take 3 measurements at one sitting and record the average.  Important lifestyle changes to control high blood pressure  Intervention  Effect on the BP  Lose extra pounds and watch your waistline Weight loss is one of the most effective lifestyle changes for controlling blood pressure. If you're overweight or obese, losing even a small amount of weight can help reduce blood pressure. Blood pressure might go down by about 1 millimeter of mercury (mm Hg) with each kilogram (about 2.2 pounds) of weight lost.  Exercise regularly As a general goal, aim for at least 30 minutes of moderate physical activity every day. Regular physical activity can lower high blood pressure by about 5 to 8 mm Hg.  Eat a healthy diet Eating a diet rich in whole grains, fruits, vegetables, and low-fat dairy products and low in saturated fat and cholesterol. A healthy diet can lower high blood  pressure by up to 11 mm Hg.  Reduce salt (sodium) in your diet Even a small reduction of sodium in the diet can improve heart health and reduce high blood pressure by about 5 to 6 mm Hg.  Limit alcohol One drink equals 12 ounces of beer, 5 ounces of wine, or 1.5 ounces of 80-proof liquor.  Limiting alcohol to less than one drink a day for women or two drinks a day for men can help lower blood pressure by about 4 mm Hg.   If you have any questions or concerns please use My Chart to send questions or call the office at [phone]

## 2022-11-29 NOTE — Assessment & Plan Note (Signed)
Assessment/Plan: BP is uncontrolled goal <130/80 1st reading in office BP 114/75 with heart rate 95 and 2nd reading 125/89 mHg  Takes current BP medications regularly and tolerates them well  without any side effects Denies SOB, palpitation, chest pain, headaches,or swelling [employer] home cuff is accurate for systolic blood pressure (SBP) but slightly off for diastolic blood pressure (DBP)  Reiterated the importance of adherence to medications, regular exercise and low salt diet  No medications changes today patient to keep close eye on BP at home if start staying above the goal (>130/80) persistently she need to reach out to the office  Continue taking lisinopril 40 mg daily and HCTZ 6.25 mg daily In future can consider - increase the dose of thiazide ( per last lab in March CrCl >30) or add alpha blocker

## 2023-01-13 ENCOUNTER — Other Ambulatory Visit: Payer: Self-pay | Admitting: Internal Medicine

## 2023-01-13 ENCOUNTER — Other Ambulatory Visit (INDEPENDENT_AMBULATORY_CARE_PROVIDER_SITE_OTHER): Payer: Commercial Managed Care - PPO

## 2023-01-13 DIAGNOSIS — E559 Vitamin D deficiency, unspecified: Secondary | ICD-10-CM | POA: Diagnosis not present

## 2023-01-13 DIAGNOSIS — E7800 Pure hypercholesterolemia, unspecified: Secondary | ICD-10-CM | POA: Diagnosis not present

## 2023-01-13 DIAGNOSIS — R739 Hyperglycemia, unspecified: Secondary | ICD-10-CM

## 2023-01-13 DIAGNOSIS — E538 Deficiency of other specified B group vitamins: Secondary | ICD-10-CM | POA: Diagnosis not present

## 2023-01-13 LAB — CBC WITH DIFFERENTIAL/PLATELET
Basophils Absolute: 0.1 10*3/uL (ref 0.0–0.1)
Basophils Relative: 0.9 % (ref 0.0–3.0)
Eosinophils Absolute: 0.2 10*3/uL (ref 0.0–0.7)
Eosinophils Relative: 2 % (ref 0.0–5.0)
HCT: 44.9 % (ref 36.0–46.0)
Hemoglobin: 15 g/dL (ref 12.0–15.0)
Lymphocytes Relative: 24.1 % (ref 12.0–46.0)
Lymphs Abs: 2.2 10*3/uL (ref 0.7–4.0)
MCHC: 33.4 g/dL (ref 30.0–36.0)
MCV: 88.4 fl (ref 78.0–100.0)
Monocytes Absolute: 0.6 10*3/uL (ref 0.1–1.0)
Monocytes Relative: 6 % (ref 3.0–12.0)
Neutro Abs: 6.1 10*3/uL (ref 1.4–7.7)
Neutrophils Relative %: 67 % (ref 43.0–77.0)
Platelets: 295 10*3/uL (ref 150.0–400.0)
RBC: 5.08 Mil/uL (ref 3.87–5.11)
RDW: 13.8 % (ref 11.5–15.5)
WBC: 9.1 10*3/uL (ref 4.0–10.5)

## 2023-01-13 LAB — URINALYSIS, ROUTINE W REFLEX MICROSCOPIC
Bilirubin Urine: NEGATIVE
Ketones, ur: NEGATIVE
Nitrite: NEGATIVE
Specific Gravity, Urine: 1.015 (ref 1.000–1.030)
Total Protein, Urine: NEGATIVE
Urine Glucose: NEGATIVE
Urobilinogen, UA: 0.2 (ref 0.0–1.0)
pH: 6.5 (ref 5.0–8.0)

## 2023-01-13 LAB — BASIC METABOLIC PANEL
BUN: 11 mg/dL (ref 6–23)
CO2: 28 mEq/L (ref 19–32)
Calcium: 9.1 mg/dL (ref 8.4–10.5)
Chloride: 97 mEq/L (ref 96–112)
Creatinine, Ser: 1.36 mg/dL — ABNORMAL HIGH (ref 0.40–1.20)
GFR: 39.23 mL/min — ABNORMAL LOW (ref >60.00)
Glucose, Bld: 114 mg/dL — ABNORMAL HIGH (ref 70–99)
Potassium: 3.9 mEq/L (ref 3.5–5.1)
Sodium: 134 mEq/L — ABNORMAL LOW (ref 135–145)

## 2023-01-13 LAB — LIPID PANEL
Cholesterol: 151 mg/dL (ref 0–200)
HDL: 52.9 mg/dL (ref >39.00)
LDL Cholesterol: 68 mg/dL (ref 0–99)
NonHDL: 97.79
Total CHOL/HDL Ratio: 3
Triglycerides: 150 mg/dL — ABNORMAL HIGH (ref 0.0–149.0)
VLDL: 30 mg/dL (ref 0.0–40.0)

## 2023-01-13 LAB — HEPATIC FUNCTION PANEL
ALT: 15 U/L (ref 0–35)
AST: 18 U/L (ref 0–37)
Albumin: 3.7 g/dL (ref 3.5–5.2)
Alkaline Phosphatase: 55 U/L (ref 39–117)
Bilirubin, Direct: 0.1 mg/dL (ref 0.0–0.3)
Total Bilirubin: 0.4 mg/dL (ref 0.2–1.2)
Total Protein: 7 g/dL (ref 6.0–8.3)

## 2023-01-13 LAB — TSH: TSH: 1.18 u[IU]/mL (ref 0.35–5.50)

## 2023-01-13 LAB — HEMOGLOBIN A1C: Hgb A1c MFr Bld: 6.2 % (ref 4.6–6.5)

## 2023-01-13 LAB — VITAMIN B12: Vitamin B-12: 730 pg/mL (ref 211–911)

## 2023-01-13 LAB — VITAMIN D 25 HYDROXY (VIT D DEFICIENCY, FRACTURES): VITD: 49.63 ng/mL (ref 30.00–100.00)

## 2023-01-13 MED ORDER — CEPHALEXIN 500 MG PO CAPS: 500.0000 mg | ORAL_CAPSULE | Freq: Three times a day (TID) | ORAL | 0 refills | Status: DC

## 2023-01-15 ENCOUNTER — Ambulatory Visit: Payer: Commercial Managed Care - PPO | Admitting: Internal Medicine

## 2023-01-15 ENCOUNTER — Encounter: Payer: Self-pay | Admitting: Internal Medicine

## 2023-01-15 VITALS — BP 124/68 | HR 90 | Temp 97.9°F | Ht 62.0 in | Wt 143.0 lb

## 2023-01-15 DIAGNOSIS — F17200 Nicotine dependence, unspecified, uncomplicated: Secondary | ICD-10-CM

## 2023-01-15 DIAGNOSIS — E538 Deficiency of other specified B group vitamins: Secondary | ICD-10-CM

## 2023-01-15 DIAGNOSIS — I10 Essential (primary) hypertension: Secondary | ICD-10-CM

## 2023-01-15 DIAGNOSIS — N1831 Chronic kidney disease, stage 3a: Secondary | ICD-10-CM

## 2023-01-15 DIAGNOSIS — H9191 Unspecified hearing loss, right ear: Secondary | ICD-10-CM | POA: Diagnosis not present

## 2023-01-15 DIAGNOSIS — N390 Urinary tract infection, site not specified: Secondary | ICD-10-CM | POA: Insufficient documentation

## 2023-01-15 DIAGNOSIS — Z0001 Encounter for general adult medical examination with abnormal findings: Secondary | ICD-10-CM | POA: Diagnosis not present

## 2023-01-15 DIAGNOSIS — H6121 Impacted cerumen, right ear: Secondary | ICD-10-CM

## 2023-01-15 DIAGNOSIS — L989 Disorder of the skin and subcutaneous tissue, unspecified: Secondary | ICD-10-CM

## 2023-01-15 DIAGNOSIS — E7800 Pure hypercholesterolemia, unspecified: Secondary | ICD-10-CM

## 2023-01-15 DIAGNOSIS — R739 Hyperglycemia, unspecified: Secondary | ICD-10-CM

## 2023-01-15 NOTE — Assessment & Plan Note (Signed)
Lab Results  Component Value Date   LDLCALC 68 01/13/2023   Stable, pt to continue current statin lipitor 80 mg qd

## 2023-01-15 NOTE — Progress Notes (Signed)
Patient ID: Cindy Reid, female   DOB: 1951/10/22, 72 y.o.   MRN: [id number]         Chief Complaint:: wellness exam and Annual Exam (Referral needed for dermatology and ENT)  , urinary frequency, facial skin lesion, right hearing loss, ow b12, ckd, htn, hyperglycemia, hld, smoker       HPI:  Cindy Reid is a 72 y.o. female here for wellness exam; declines prevnar and shingrix o/w up to date                        Also Denies urinary symptoms such as dysuria, urgency, flank pain, hematuria or n/v, fever, chills but has had some mild urinary frequency.  Pt denies chest pain, increased sob or doe, wheezing, orthopnea, PND, increased LE swelling, palpitations, dizziness or syncope.   Pt denies polydipsia, polyuria, or new focal neuro s/s.  Pt denies fever, wt loss, night sweats, loss of appetite, or other constitutional symptoms  Does have new right nasolabial skin lesion that occasionally seems to bleed slightly and no healing in last few months.  Also has right hearing loss, unable to be improved on her own with irrigation.     Wt Readings from Last 3 Encounters:  01/15/23 143 lb (64.9 kg)  09/23/22 151 lb 11.2 oz (68.8 kg)  08/27/22 151 lb 12.8 oz (68.9 kg)   BP Readings from Last 3 Encounters:  01/15/23 124/68  11/29/22 125/89  10/29/22 (!) 148/83   Immunization History  Administered Date(s) Administered   Influenza,inj,Quad PF,6+ Mos 12/07/2015   PFIZER(Purple Top)SARS-COV-2 Vaccination 12/06/2019, 12/31/2019   Tdap 12/07/2015  There are no preventive care reminders to display for this patient.    Past Medical History:  Diagnosis Date   Carotid stenosis 07/28/2017   1-39% bilaterally 05/2015.   Claudication in peripheral vascular disease ([place]) 07/13/2020   Epilepsy ([place])    Hyperlipidemia    Hypertension    Migraine    Past Surgical History:  Procedure Laterality Date   COLONOSCOPY     MOUTH SURGERY     TUBAL LIGATION      reports that she has been smoking  cigarettes. She has been smoking an average of 2 packs per day. She has never used smokeless tobacco. She reports current alcohol use. She reports that she does not use drugs. family history includes Cancer in her father and mother; Colon cancer in her maternal aunt; Diabetes in her mother; Heart disease in her father. Allergies  Allergen Reactions   Amlodipine     gingival hyperplasia   Dilantin [Phenytoin] Other (See Comments)    UNSURE OF REACTION   Current Outpatient Medications on File Prior to Visit  Medication Sig Dispense Refill   albuterol (VENTOLIN HFA) 108 (90 Base) MCG/ACT inhaler Inhale 1-2 puffs into the lungs every 6 (six) hours as needed for wheezing or shortness of breath. 1 each 0   aspirin EC 81 MG tablet Take 1 tablet (81 mg total) by mouth daily. 90 tablet 11   atorvastatin (LIPITOR) 80 MG tablet TAKE 1 TABLET DAILY 90 tablet 2   Calcium Carb-Cholecalciferol (CALCIUM 1000 + D PO) Take by mouth.     cephALEXin (KEFLEX) 500 MG capsule Take 1 capsule (500 mg total) by mouth 3 (three) times daily. 30 capsule 0   hydrochlorothiazide (HYDRODIURIL) 12.5 MG tablet TAKE ONE-HALF (1/2) TABLET DAILY 45 tablet 3   lisinopril (ZESTRIL) 40 MG tablet TAKE 1 TABLET DAILY 90 tablet  3   No current facility-administered medications on file prior to visit.        ROS:  All others reviewed and negative.  Objective        PE:  BP 124/68   Pulse 90   Temp 97.9 F (36.6 C) (Oral)   Ht 5\' 2"  (1.575 m)   Wt 143 lb (64.9 kg)   SpO2 99%   BMI 26.16 kg/m                 Constitutional: Pt appears in NAD               HENT: Head: NCAT.                Right Ear: External ear normal.                 Left Ear: External ear normal.                Eyes: . Pupils are equal, round, and reactive to light. Conjunctivae and EOM are normal               Nose: without d/c or deformity               Neck: Neck supple. Gross normal ROM               Cardiovascular: Normal rate and regular rhythm.                  Pulmonary/Chest: Effort normal and breath sounds without rales or wheezing.                Abd:  Soft, NT, ND, + BS, no organomegaly               Neurological: Pt is alert. At baseline orientation, motor grossly intact               Skin: Skin is warm. No rashes, no other new lesions, LE edema - none               Psychiatric: Pt behavior is normal without agitation   Micro: none  Cardiac tracings I have personally interpreted today:  none  Pertinent Radiological findings (summarize): none   Lab Results  Component Value Date   WBC 9.1 01/13/2023   HGB 15.0 01/13/2023   HCT 44.9 01/13/2023   PLT 295.0 01/13/2023   GLUCOSE 114 (H) 01/13/2023   CHOL 151 01/13/2023   TRIG 150.0 (H) 01/13/2023   HDL 52.90 01/13/2023   LDLDIRECT 65.0 01/03/2022   LDLCALC 68 01/13/2023   ALT 15 01/13/2023   AST 18 01/13/2023   NA 134 (L) 01/13/2023   K 3.9 01/13/2023   CL 97 01/13/2023   CREATININE 1.36 (H) 01/13/2023   BUN 11 01/13/2023   CO2 28 01/13/2023   TSH 1.18 01/13/2023   HGBA1C 6.2 01/13/2023   Assessment/Plan:  Cindy Reid is a 72 y.o. White or Caucasian [1] female with  has a past medical history of Carotid stenosis (07/28/2017), Claudication in peripheral vascular disease ([place]) (07/13/2020), Epilepsy ([place]), Hyperlipidemia, Hypertension, and Migraine.  Encounter for well adult exam with abnormal findings Age and sex appropriate education and counseling updated with regular exercise and diet Referrals for preventative services - none needed Immunizations addressed - declines shingrix and prevnar Smoking counseling  - pt counsled to quit smoking, pt not ready Evidence for depression or other mood disorder - none significant Most recent labs reviewed.  I have personally reviewed and have noted: 1) the patient's medical and social history 2) [name] current medications and supplements 3) [name] height, weight, and BMI have been recorded in the  chart   B12 deficiency Lab Results  Component Value Date   VITAMINB12 730 01/13/2023   Stable, cont oral replacement - b12 1000 mcg qd   CKD (chronic kidney disease) stage 3, GFR 30-59 ml/min (HCC) Lab Results  Component Value Date   CREATININE 1.36 (H) 01/13/2023   Stable overall, cont to avoid nephrotoxins   Essential hypertension BP Readings from Last 3 Encounters:  01/15/23 124/68  11/29/22 125/89  10/29/22 (!) 148/83   Stable, pt to continue medical treatment hct 12.5 mg qd, lisinopril 40 qd   Hyperglycemia Lab Results  Component Value Date   HGBA1C 6.2 01/13/2023   Stable, pt to continue current medical treatment  - diet, wt control   Hyperlipidemia Lab Results  Component Value Date   [place] 68 01/13/2023   Stable, pt to continue current statin lipitor 80 mg qd   Smoker Pt counsled to quit, pt not ready  UTI (urinary tract infection) Recent onset, pt tolerating cephalexin course, non toxic, ok to finish current antibx  Hearing loss of right ear Will irrigate for wax today, Ceruminosis is noted.  Wax is removed by syringing and manual debridement. Instructions for home care to prevent wax buildup are given., also refer ENT per pt request  Facial skin lesion Right nasolabial, may need plastic surgury given the location, but pt reqeusts dermatology first  Followup: No follow-ups on file.  Cathlean Cower, MD 01/15/2023 1:50 PM [facility]

## 2023-01-15 NOTE — Assessment & Plan Note (Signed)
Lab Results  Component Value Date   VITAMINB12 730 01/13/2023   Stable, cont oral replacement - b12 1000 mcg qd

## 2023-01-15 NOTE — Assessment & Plan Note (Signed)
Will irrigate for wax today, Ceruminosis is noted.  Wax is removed by syringing and manual debridement. Instructions for home care to prevent wax buildup are given., also refer ENT per pt request

## 2023-01-15 NOTE — Assessment & Plan Note (Signed)
Age and sex appropriate education and counseling updated with regular exercise and diet Referrals for preventative services - none needed Immunizations addressed - declines shingrix and prevnar Smoking counseling  - pt counsled to quit smoking, pt not ready Evidence for depression or other mood disorder - none significant Most recent labs reviewed. I have personally reviewed and have noted: 1) the patient's medical and social history 2) [name] current medications and supplements 3) [name] height, weight, and BMI have been recorded in the chart

## 2023-01-15 NOTE — Progress Notes (Signed)
PRE-PROCEDURE EXAM: Left TM cannot be visualized due to total occlusion/impaction of the ear canal.  PROCEDURE INDICATION: remove wax to visualize ear drum & relieve discomfort  CONSENT:  Verbal     PROCEDURE NOTE:     RIGHT EAR:  I used warm water irrigation under direct visualization with the otoscope to free the wax bolus from the ear canal.    POST- PROCEDURE EXAM: TMs successfully visualized and found to have no erythema     The patient tolerated the procedure well.   

## 2023-01-15 NOTE — Assessment & Plan Note (Signed)
Lab Results  Component Value Date   CREATININE 1.36 (H) 01/13/2023   Stable overall, cont to avoid nephrotoxins

## 2023-01-15 NOTE — Assessment & Plan Note (Signed)
Lab Results  Component Value Date   HGBA1C 6.2 01/13/2023   Stable, pt to continue current medical treatment  - diet, wt control

## 2023-01-15 NOTE — Addendum Note (Signed)
Addended by: Max Sane on: 01/15/2023 02:46 PM   Modules accepted: Orders

## 2023-01-15 NOTE — Assessment & Plan Note (Signed)
Recent onset, pt tolerating cephalexin course, non toxic, ok to finish current antibx

## 2023-01-15 NOTE — Assessment & Plan Note (Signed)
BP Readings from Last 3 Encounters:  01/15/23 124/68  11/29/22 125/89  10/29/22 (!) 148/83   Stable, pt to continue medical treatment hct 12.5 mg qd, lisinopril 40 qd

## 2023-01-15 NOTE — Assessment & Plan Note (Signed)
Right nasolabial, may need plastic surgury given the location, but pt reqeusts dermatology first

## 2023-01-15 NOTE — Assessment & Plan Note (Signed)
Pt counsled to quit, pt not ready °

## 2023-01-15 NOTE — Patient Instructions (Addendum)
Please finish the antibiotic as prescribed  Your ear was irrigated of Wax today  Please continue all other medications as before, and refills have been done if requested.  Please have the pharmacy call with any other refills you may need.  Please continue your efforts at being more active, low cholesterol diet, and weight control.  You are otherwise up to date with prevention measures today.  Please keep your appointments with your specialists as you may have planned  You will be contacted regarding the referral for: ENT and Dermatology  Please make an Appointment to return for your 1 year visit, or sooner if needed, with Lab testing by Appointment as well, to be done about 3-5 days before at the [facility] (so this is for TWO appointments - please see the scheduling desk as you leave)

## 2023-02-26 ENCOUNTER — Other Ambulatory Visit: Payer: Self-pay | Admitting: Cardiovascular Disease

## 2023-02-26 NOTE — Telephone Encounter (Signed)
Rx request sent to pharmacy.  

## 2023-04-25 ENCOUNTER — Encounter (HOSPITAL_BASED_OUTPATIENT_CLINIC_OR_DEPARTMENT_OTHER): Payer: Self-pay | Admitting: Cardiovascular Disease

## 2023-04-25 ENCOUNTER — Ambulatory Visit (HOSPITAL_BASED_OUTPATIENT_CLINIC_OR_DEPARTMENT_OTHER): Payer: Commercial Managed Care - PPO | Admitting: Cardiovascular Disease

## 2023-04-25 VITALS — BP 148/72 | HR 60 | Ht 62.0 in | Wt 147.5 lb

## 2023-04-25 DIAGNOSIS — E7800 Pure hypercholesterolemia, unspecified: Secondary | ICD-10-CM | POA: Diagnosis not present

## 2023-04-25 DIAGNOSIS — I6523 Occlusion and stenosis of bilateral carotid arteries: Secondary | ICD-10-CM | POA: Diagnosis not present

## 2023-04-25 DIAGNOSIS — N1831 Chronic kidney disease, stage 3a: Secondary | ICD-10-CM

## 2023-04-25 DIAGNOSIS — I10 Essential (primary) hypertension: Secondary | ICD-10-CM

## 2023-04-25 DIAGNOSIS — Z5181 Encounter for therapeutic drug level monitoring: Secondary | ICD-10-CM

## 2023-04-25 MED ORDER — HYDROCHLOROTHIAZIDE 12.5 MG PO TABS: 12.5000 mg | ORAL_TABLET | Freq: Every day | ORAL | 3 refills | Status: DC

## 2023-04-25 MED ORDER — HYDROCHLOROTHIAZIDE 25 MG PO TABS: 25.0000 mg | ORAL_TABLET | Freq: Every day | ORAL | 3 refills | Status: DC

## 2023-04-25 NOTE — Patient Instructions (Addendum)
Medication Instructions:  INCREASE HYDROCHLOROTHIAZIDE TO 12.5 MG DAILY   *If you need a refill on your cardiac medications before your next appointment, please call your pharmacy*  Lab Work: BMET IN ABOUT A WEEK   If you have labs (blood work) drawn today and your tests are completely normal, you will receive your results only by: MyChart Message (if you have MyChart) OR A paper copy in the mail If you have any lab test that is abnormal or we need to change your treatment, we will call you to review the results.  Testing/Procedures: NONE  Follow-Up: At [facility], you and your health needs are our priority.  As part of our continuing mission to provide you with exceptional heart care, we have created designated Provider Care Teams.  These Care Teams include your primary Cardiologist (physician) and Advanced Practice Providers (APPs -  Physician Assistants and Nurse Practitioners) who all work together to provide you with the care you need, when you need it.  We recommend signing up for the patient portal called "MyChart".  Sign up information is provided on this After Visit Summary.  MyChart is used to connect with patients for Virtual Visits (Telemedicine).  Patients are able to view lab/test results, encounter notes, upcoming appointments, etc.  Non-urgent messages can be sent to your provider as well.   To learn more about what you can do with MyChart, go to [email or web address].    Your next appointment:   6 month(s)  Provider:   Chilton Si, MD   PHARM D AT [facility] IN 1 MONTH   Other Instructions MONITOR AND LOG BLOOD PRESSURE AT HOME. BRING MACHINE AND LOG TO FOLLOW UP IN 1 MONTH

## 2023-04-25 NOTE — Progress Notes (Signed)
Cardiology Office Note:  .    Date:  04/25/2023  ID:  Cindy Reid, DOB 07-26-1951, MRN [id number] PCP: Corwin Levins, MD  [facility] Providers Cardiologist:  None     History of Present Illness: Cindy Reid is a 72 y.o. female with hypertension, hyperlipidemia, mild carotid stenosis, PAD, and palpitations who presents for follow up.  On 05/20/15 Cindy Reid was evaluated in the ED for a syncopal episode.  She was in the bathroom at [facility] with an upset stomach and diarrhea.  After using the bathroom and washing her hands, she was on her way out and the next thing she remembers she was on the floor.  There was no preceding chest pain, shortness of breath, palpitations, or warning. She has never had an episode like this in the past. She is brought to the ED for evaluation and was thought to have had an orthostatic hypotension episode.  Cindy Reid followed up with her primary care doctor who ordered an echocardiogram that was unremarkable aside from grade 1 diastolic dysfunction. A 48 hour Holter 05/2015 revealed sinus rhythm with rare PACs and PVCs.  She also had rare, short-lived, atrial runs that lasted for less than 2 seconds each.  She had a carotid ultrasound that showed mild bilateral ICA stenosis but no signficant obstruction.     She reported some claudication. ABIs 08/2020 revealed greater than 50% stenosis in the proximal right common iliac artery, less than 50% stenosis in the mid and distal common iliac artery, and 50 to 75% stenosis in the mid SFA.  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  She was referred to Dr. Kirke Corin who recommended medical therapy.  She last saw him 07/2022 and was stable.  We discussed smoking cessation but she was not interested at the time.  She was able to cut back to 1.5 packs/week.     At her visit 08/2022 her blood pressure was elevated both initially and on repeat, but she wanted to track it at  home. She remained uninterested in smoking cessation. She saw our pharmacist 10/2022, and blood pressure was elevated but she had forgotten to take her medication and again declined to make changes. At the following visit her home blood pressures were averaging 120s/80s so no changes were made.  Today, she states that she is feeling fine overall but has been struggling with a significant cough and congestion for a couple months. Also associated with a lot of mucus production. She is in the process of making an appointment with ENT. In the office today her blood pressure is elevated to 162/72, and on manual recheck improved to 148/72. She endorses average readings around 135-140 systolic at home. Her blood pressure was well controlled during her PCP visit in March. Early heart beats were auscultated on today's exam. She denies being aware of any palpitations. For exercise she routinely is walking her dogs, and also walks throughout her house. Generally feels okay with exercise without shortness of breath or anginal symptoms. At times she has pain in her legs only if she moves the wrong way causing a "catching" pain. 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 Currently she admits to "too much" smoking, maybe more since her last visit. She reports multiple surgical procedures of her mouth which has been stressful. She denies any chest pain, peripheral edema, lightheadedness, headaches, syncope, orthopnea, or PND.  ROS:  Please see the history of present illness. All other systems are reviewed and negative.  (+) Cough (+) Congestion (+) Stress (+) LE pain/arthralgias  Studies Reviewed: Marland Kitchen   EKG Interpretation Date/Time:  Friday April 25 2023 10:21:26 EDT Ventricular Rate:  90 PR Interval:  168 QRS Duration:  74 QT Interval:  362 QTC Calculation: 442 R Axis:   23  Text Interpretation: Normal sinus rhythm Normal ECG When compared with ECG of 20-May-2015 11:09, PREVIOUS ECG IS  PRESENT Confirmed by Chilton Si ([id number]) on 04/25/2023 10:34:38 AM    Risk Assessment/Calculations:     HYPERTENSION CONTROL Vitals:   04/25/23 0920 04/25/23 1009  BP: (!) 162/72 (!) 148/72    [name] blood pressure is elevated above target today.  In order to address the patient's elevated BP:           Physical Exam:    VS:  BP (!) 148/72 (BP Location: Right Arm, Patient Position: Sitting, Cuff Size: Normal)   Pulse 60   Ht 5\' 2"  (1.575 m)   Wt 147 lb 8 oz (66.9 kg)   SpO2 97%   BMI 26.98 kg/m  , BMI Body mass index is 26.98 kg/m. GENERAL:  Well appearing HEENT: Pupils equal round and reactive, fundi not visualized, oral mucosa unremarkable NECK:  No jugular venous distention, waveform within normal limits, carotid upstroke brisk and symmetric, no bruits, no thyromegaly LUNGS:  Clear to auscultation bilaterally HEART: Mostly regular with frequent ectopy.  PMI not displaced or sustained,S1 and S2 within normal limits, no S3, no S4, no clicks, no rubs, no murmurs ABD:  Flat, positive bowel sounds normal in frequency in pitch, no bruits, no rebound, no guarding, no midline pulsatile mass, no hepatomegaly, no splenomegaly EXT:  Unable to palpate DP/TP pulses, no edema, no cyanosis no clubbing SKIN:  No rashes no nodules NEURO:  Cranial nerves II through XII grossly intact, motor grossly intact throughout PSYCH:  Cognitively intact, oriented to person place and time  Wt Readings from Last 3 Encounters:  04/25/23 147 lb 8 oz (66.9 kg)  01/15/23 143 lb (64.9 kg)  09/23/22 151 lb 11.2 oz (68.8 kg)     ASSESSMENT AND PLAN: .    # Hypertension: Elevated blood pressure readings at home (around 135-140) and in clinic (162). Patient is currently on Lisinopril and Hydrochlorothiazide (half tablet). -Increase Hydrochlorothiazide to a whole tablet daily. -Check labs in a week to ensure stability. -Consider combination pill with Lisinopril if patient tolerates increased  Hydrochlorothiazide dose. -Follow up with pharmacist in 1-2 months to assess blood pressure control.  # Peripheral Arterial Disease: Patient is asymptomatic. Currently on Aspirin for management. -Continue Aspirin. -Follow up with Dr. Kirke Corin in a year as previously planned.  #  Hip Pain: Patient reports pain in the hip region, possibly due to arthritis. -Encouraged patient to continue physical activity as tolerated.  # PAC/PVCs: Noted irregular heart rhythm during physical examination. Patient has a history of premature atrial and ventricular contractions. -Sinus rhythm on EKG  # Tobacco Use: Patient is a current smoker and reports possibly smoking more recently. -Encouraged patient to consider smoking cessation for overall health improvement.       Dispo:  FU with PharmD in 1-2 months. FU with Burkley Dech C. Duke Salvia, MD, [facility] in 6 months.  I,Mathew Stumpf,acting as a Neurosurgeon for Chilton Si, MD.,have documented all relevant documentation on the behalf of Chilton Si, MD,as directed by  Chilton Si, MD while in the presence of [place],  MD.  I, Colbi Staubs C. Duke Salvia, MD have reviewed all documentation for this visit.  The documentation of the exam, diagnosis, procedures, and orders on 04/25/2023 are all accurate and complete.   Signed, Chilton Si, MD

## 2023-04-25 NOTE — Addendum Note (Signed)
Addended by: Burnell Blanks on: 04/25/2023 04:39 PM   Modules accepted: Orders

## 2023-04-28 ENCOUNTER — Telehealth: Payer: Self-pay | Admitting: Cardiovascular Disease

## 2023-04-28 NOTE — Telephone Encounter (Signed)
Per last office note with Dr. Duke Salvia, patient is to be taking hydrochlorothiazide 12.5mg  daily. Returned call to [facility] and provided prescription clarification.

## 2023-04-28 NOTE — Telephone Encounter (Signed)
Pt c/o medication issue:  1. Name of Medication:   hydrochlorothiazide (HYDRODIURIL) 12.5 MG tablet    2. How are you currently taking this medication (dosage and times per day)?  Take 1 tablet (12.5 mg total) by mouth daily.       3. Are you having a reaction (difficulty breathing--STAT)? No   4. What is your medication issue? Chris from [facility] stated they've received a refill request for 12.5MG  but also for 25MG  for this medication and would like a callback at [phone] for clarity and verification on which dose is correct. Reference number is [id number]. Please advise

## 2023-05-09 LAB — BASIC METABOLIC PANEL
BUN/Creatinine Ratio: 11 — ABNORMAL LOW (ref 12–28)
BUN: 15 mg/dL (ref 8–27)
CO2: 27 mmol/L (ref 20–29)
Calcium: 9.5 mg/dL (ref 8.7–10.3)
Chloride: 94 mmol/L — ABNORMAL LOW (ref 96–106)
Creatinine, Ser: 1.36 mg/dL — ABNORMAL HIGH (ref 0.57–1.00)
Glucose: 93 mg/dL (ref 70–99)
Potassium: 4.3 mmol/L (ref 3.5–5.2)
Sodium: 136 mmol/L (ref 134–144)
eGFR: 42 mL/min/{1.73_m2} — ABNORMAL LOW (ref >59)

## 2023-05-26 ENCOUNTER — Encounter: Payer: Self-pay | Admitting: Pharmacist

## 2023-05-26 ENCOUNTER — Ambulatory Visit: Payer: Commercial Managed Care - PPO | Attending: Cardiology | Admitting: Pharmacist

## 2023-05-26 VITALS — BP 129/79 | HR 89

## 2023-05-26 DIAGNOSIS — I10 Essential (primary) hypertension: Secondary | ICD-10-CM

## 2023-05-26 NOTE — Patient Instructions (Signed)
It was nice meeting you today  Your blood pressure goal is <130/80 and looks well controlled today  Please continue your:  Hydrochlorothiazide 12.5mg  daily Lisinopril 40mg  daily   Continue to watch how much salt you are eating and continue to monitor your blood pressure at home. Let us know if your readings begin to trend higher  Laural Golden, PharmD, BCACP, CDCES, CPP [address] [place], [place], [address] Phone: [phone], Fax: [phone]

## 2023-05-26 NOTE — Progress Notes (Signed)
Patient ID: Cindy Reid                 DOB: [birth year]                      MRN: [id number]     HPI: Cindy Reid is a 72 y.o. female referred by Dr. Duke Salvia to HTN clinic. PMH is significant for HTN, carotid stenosis, PAD, CKD, and smoking. Hydrochlorothiazide was increased to 12.5mg  at last visit with Dr Duke Salvia.  Patient presents today with blood pressure cuff. Was unsure how to read results. Was getting confused between diastolic reading and pulse reading. Had patient check with home Omron cuff in room: 131/80, pulse 89. Patient used correct technique and I explained what each value meant.  Currently managed on hydrochlorothiazide 12.5mg  and lisinopril 40mg  daily with no patient reported adverse effects. Follow up labs wre stable.  Patient says they do not use much salt at home, only when they eat out, which is rarely.  Continues to smoke more than a pack per day but will not say how many.  Home blood pressure log: 7/29: 118/69 91 7/28: 115/64 85, 125/75 71 7/27: 113/63 87  106/59  81  Current HTN meds:  Hydrochlorothiazide 12.5mg  daily Lisinopril 40mg  daily  BP goal: <130/80   Wt Readings from Last 3 Encounters:  04/25/23 147 lb 8 oz (66.9 kg)  01/15/23 143 lb (64.9 kg)  09/23/22 151 lb 11.2 oz (68.8 kg)   BP Readings from Last 3 Encounters:  04/25/23 (!) 148/72  01/15/23 124/68  11/29/22 125/89   Pulse Readings from Last 3 Encounters:  04/25/23 60  01/15/23 90  11/29/22 95    Renal function: CrCl cannot be calculated (Unknown ideal weight.).  Past Medical History:  Diagnosis Date   Carotid stenosis 07/28/2017   1-39% bilaterally 05/2015.   Claudication in peripheral vascular disease (HCC) 07/13/2020   Epilepsy (HCC)    Hyperlipidemia    Hypertension    Migraine     Current Outpatient Medications on File Prior to Visit  Medication Sig Dispense Refill   albuterol (VENTOLIN HFA) 108 (90 Base) MCG/ACT inhaler Inhale 1-2 puffs into the lungs every 6  (six) hours as needed for wheezing or shortness of breath. 1 each 0   aspirin EC 81 MG tablet Take 1 tablet (81 mg total) by mouth daily. 90 tablet 11   atorvastatin (LIPITOR) 80 MG tablet TAKE 1 TABLET DAILY 90 tablet 3   Calcium Carb-Cholecalciferol (CALCIUM 1000 + D PO) Take by mouth.     hydrochlorothiazide (HYDRODIURIL) 12.5 MG tablet Take 1 tablet (12.5 mg total) by mouth daily. 90 tablet 3   lisinopril (ZESTRIL) 40 MG tablet TAKE 1 TABLET DAILY 90 tablet 3   No current facility-administered medications on file prior to visit.    Allergies  Allergen Reactions   Amlodipine     gingival hyperplasia   Dilantin [Phenytoin] Other (See Comments)    UNSURE OF REACTION     Assessment/Plan:  1. Hypertension -    Patient BP in room 129/79 which is at goal of <130/80. Home readings also at goal. Tolerating HTN medications well. No med changes needed at this time. Patient not interested in smoking cessation at this time.  Continue: Lisinopril 40mg  daily Hydrochlorothiazide 12.5mg  daily  Laural Golden, PharmD, BCACP, CDCES, CPP [address] [place], [place], [address] Phone: [phone], Fax: [phone]

## 2023-09-08 ENCOUNTER — Other Ambulatory Visit: Payer: Self-pay | Admitting: Internal Medicine

## 2023-09-08 ENCOUNTER — Other Ambulatory Visit: Payer: Self-pay

## 2023-09-29 ENCOUNTER — Ambulatory Visit (HOSPITAL_COMMUNITY)
Admission: RE | Admit: 2023-09-29 | Discharge: 2023-09-29 | Disposition: A | Discharge status: Home / Self Care | Payer: Commercial Managed Care - PPO | Source: Ambulatory Visit | Attending: Cardiology | Admitting: Cardiology

## 2023-09-29 DIAGNOSIS — I6523 Occlusion and stenosis of bilateral carotid arteries: Secondary | ICD-10-CM | POA: Diagnosis not present

## 2024-01-13 ENCOUNTER — Other Ambulatory Visit (INDEPENDENT_AMBULATORY_CARE_PROVIDER_SITE_OTHER)

## 2024-01-13 ENCOUNTER — Ambulatory Visit: Attending: Cardiovascular Disease | Admitting: Cardiovascular Disease

## 2024-01-13 ENCOUNTER — Other Ambulatory Visit: Payer: Self-pay | Admitting: Internal Medicine

## 2024-01-13 ENCOUNTER — Encounter: Payer: Self-pay | Admitting: Cardiovascular Disease

## 2024-01-13 VITALS — BP 132/82 | HR 62 | Ht 62.0 in | Wt 147.8 lb

## 2024-01-13 DIAGNOSIS — I739 Peripheral vascular disease, unspecified: Secondary | ICD-10-CM

## 2024-01-13 DIAGNOSIS — E785 Hyperlipidemia, unspecified: Secondary | ICD-10-CM

## 2024-01-13 DIAGNOSIS — E7800 Pure hypercholesterolemia, unspecified: Secondary | ICD-10-CM

## 2024-01-13 DIAGNOSIS — E559 Vitamin D deficiency, unspecified: Secondary | ICD-10-CM

## 2024-01-13 DIAGNOSIS — I10 Essential (primary) hypertension: Secondary | ICD-10-CM

## 2024-01-13 DIAGNOSIS — R739 Hyperglycemia, unspecified: Secondary | ICD-10-CM | POA: Diagnosis not present

## 2024-01-13 DIAGNOSIS — Z720 Tobacco use: Secondary | ICD-10-CM | POA: Diagnosis not present

## 2024-01-13 DIAGNOSIS — E538 Deficiency of other specified B group vitamins: Secondary | ICD-10-CM | POA: Diagnosis not present

## 2024-01-13 LAB — URINALYSIS, ROUTINE W REFLEX MICROSCOPIC
Bilirubin Urine: NEGATIVE
Ketones, ur: NEGATIVE
Leukocytes,Ua: NEGATIVE
Nitrite: NEGATIVE
Specific Gravity, Urine: 1.02 (ref 1.000–1.030)
Total Protein, Urine: NEGATIVE
Urine Glucose: NEGATIVE
Urobilinogen, UA: 1 (ref 0.0–1.0)
pH: 6 (ref 5.0–8.0)

## 2024-01-13 LAB — BASIC METABOLIC PANEL
BUN: 20 mg/dL (ref 6–23)
CO2: 28 meq/L (ref 19–32)
Calcium: 9.3 mg/dL (ref 8.4–10.5)
Chloride: 93 meq/L — ABNORMAL LOW (ref 96–112)
Creatinine, Ser: 1.43 mg/dL — ABNORMAL HIGH (ref 0.40–1.20)
GFR: 36.68 mL/min — ABNORMAL LOW (ref >60.00)
Glucose, Bld: 91 mg/dL (ref 70–99)
Potassium: 3.3 meq/L — ABNORMAL LOW (ref 3.5–5.1)
Sodium: 133 meq/L — ABNORMAL LOW (ref 135–145)

## 2024-01-13 LAB — LIPID PANEL
Cholesterol: 130 mg/dL (ref 0–200)
HDL: 53.4 mg/dL (ref >39.00)
LDL Cholesterol: 53 mg/dL (ref 0–99)
NonHDL: 76.52
Total CHOL/HDL Ratio: 2
Triglycerides: 117 mg/dL (ref 0.0–149.0)
VLDL: 23.4 mg/dL (ref 0.0–40.0)

## 2024-01-13 LAB — CBC WITH DIFFERENTIAL/PLATELET
Basophils Absolute: 0.1 10*3/uL (ref 0.0–0.1)
Basophils Relative: 0.9 % (ref 0.0–3.0)
Eosinophils Absolute: 0.1 10*3/uL (ref 0.0–0.7)
Eosinophils Relative: 0.9 % (ref 0.0–5.0)
HCT: 43.8 % (ref 36.0–46.0)
Hemoglobin: 14.9 g/dL (ref 12.0–15.0)
Lymphocytes Relative: 16.3 % (ref 12.0–46.0)
Lymphs Abs: 1.6 10*3/uL (ref 0.7–4.0)
MCHC: 34.1 g/dL (ref 30.0–36.0)
MCV: 88.8 fl (ref 78.0–100.0)
Monocytes Absolute: 0.5 10*3/uL (ref 0.1–1.0)
Monocytes Relative: 5.7 % (ref 3.0–12.0)
Neutro Abs: 7.3 10*3/uL (ref 1.4–7.7)
Neutrophils Relative %: 76.2 % (ref 43.0–77.0)
Platelets: 275 10*3/uL (ref 150.0–400.0)
RBC: 4.93 Mil/uL (ref 3.87–5.11)
RDW: 14.2 % (ref 11.5–15.5)
WBC: 9.6 10*3/uL (ref 4.0–10.5)

## 2024-01-13 LAB — HEPATIC FUNCTION PANEL
ALT: 12 U/L (ref 0–35)
AST: 18 U/L (ref 0–37)
Albumin: 4.2 g/dL (ref 3.5–5.2)
Alkaline Phosphatase: 57 U/L (ref 39–117)
Bilirubin, Direct: 0.1 mg/dL (ref 0.0–0.3)
Total Bilirubin: 0.5 mg/dL (ref 0.2–1.2)
Total Protein: 7.7 g/dL (ref 6.0–8.3)

## 2024-01-13 LAB — MICROALBUMIN / CREATININE URINE RATIO
Creatinine,U: 169.7 mg/dL
Microalb Creat Ratio: 9.9 mg/g (ref 0.0–30.0)
Microalb, Ur: 1.7 mg/dL (ref 0.0–1.9)

## 2024-01-13 LAB — VITAMIN B12: Vitamin B-12: 556 pg/mL (ref 211–911)

## 2024-01-13 LAB — VITAMIN D 25 HYDROXY (VIT D DEFICIENCY, FRACTURES): VITD: 50.91 ng/mL (ref 30.00–100.00)

## 2024-01-13 LAB — TSH: TSH: 1.03 u[IU]/mL (ref 0.35–5.50)

## 2024-01-13 LAB — HEMOGLOBIN A1C: Hgb A1c MFr Bld: 6.2 % (ref 4.6–6.5)

## 2024-01-13 NOTE — Patient Instructions (Signed)
 Medication Instructions:  No changes *If you need a refill on your cardiac medications before your next appointment, please call your pharmacy*   Lab Work: None ordered If you have labs (blood work) drawn today and your tests are completely normal, you will receive your results only by: MyChart Message (if you have MyChart) OR A paper copy in the mail If you have any lab test that is abnormal or we need to change your treatment, we will call you to review the results.   Testing/Procedures: Your physician has requested that you have an ankle brachial index (ABI). During this test an ultrasound and blood pressure cuff are used to evaluate the arteries that supply the arms and legs with blood.  Allow thirty minutes for this exam.  There are no restrictions or special instructions.  This will take place at [address] [facility], [place] [address]  Please note: We ask at that you not bring children with you during ultrasound (echo/ vascular) testing. Due to room size and safety concerns, children are not allowed in the ultrasound rooms during exams. Our front office staff cannot provide observation of children in our lobby area while testing is being conducted. An adult accompanying a patient to their appointment will only be allowed in the ultrasound room at the discretion of the ultrasound technician under special circumstances. We apologize for any inconvenience.    Follow-Up: At [facility], you and your health needs are our priority.  As part of our continuing mission to provide you with exceptional heart care, we have created designated Provider Care Teams.  These Care Teams include your primary Cardiologist (physician) and Advanced Practice Providers (APPs -  Physician Assistants and Nurse Practitioners) who all work together to provide you with the care you need, when you need it.  We recommend signing up for the patient portal called "MyChart".  Sign  up information is provided on this After Visit Summary.  MyChart is used to connect with patients for Virtual Visits (Telemedicine).  Patients are able to view lab/test results, encounter notes, upcoming appointments, etc.  Non-urgent messages can be sent to your provider as well.   To learn more about what you can do with MyChart, go to [email or web address].    Your next appointment:   6 month(s)  Provider:   Dr. Kirke Corin  Other Instructions Managing the Challenge of Quitting Smoking Quitting smoking is a physical and mental challenge. You may have cravings, withdrawal symptoms, and temptation to smoke. Before quitting, work with your health care provider to make a plan that can help you manage quitting. Making a plan before you quit may keep you from smoking when you have the urge to smoke while trying to quit. How to manage lifestyle changes Managing stress Stress can make you want to smoke, and wanting to smoke may cause stress. It is important to find ways to manage your stress. You could try some of the following: Practice relaxation techniques. Breathe slowly and deeply, in through your nose and out through your mouth. Listen to music. Soak in a bath or take a shower. Imagine a peaceful place or vacation. Get some support. Talk with family or friends about your stress. Join a support group. Talk with a counselor or therapist. Get some physical activity. Go for a walk, run, or bike ride. Play a favorite sport. Practice yoga.  Medicines Talk with your health care provider about medicines that might help you deal with cravings and make quitting  easier for you. Relationships Social situations can be difficult when you are quitting smoking. To manage this, you can: Avoid parties and other social situations where people might be smoking. Avoid alcohol. Leave right away if you have the urge to smoke. Explain to your family and friends that you are quitting smoking. Ask for support  and let them know you might be a bit grumpy. Plan activities where smoking is not an option. General instructions Be aware that many people gain weight after they quit smoking. However, not everyone does. To keep from gaining weight, have a plan in place before you quit, and stick to the plan after you quit. Your plan should include: Eating healthy snacks. When you have a craving, it may help to: Eat popcorn, or try carrots, celery, or other cut vegetables. Chew sugar-free gum. Changing how you eat. Eat small portion sizes at meals. Eat 4-6 small meals throughout the day instead of 1-2 large meals a day. Be mindful when you eat. You should avoid watching television or doing other things that might distract you as you eat. Exercising regularly. Make time to exercise each day. If you do not have time for a long workout, do short bouts of exercise for 5-10 minutes several times a day. Do some form of strengthening exercise, such as weight lifting. Do some exercise that gets your heart beating and causes you to breathe deeply, such as walking fast, running, swimming, or biking. This is very important. Drinking plenty of water or other low-calorie or no-calorie drinks. Drink enough fluid to keep your urine pale yellow.  How to recognize withdrawal symptoms Your body and mind may experience discomfort as you try to get used to not having nicotine in your system. These effects are called withdrawal symptoms. They may include: Feeling hungrier than normal. Having trouble concentrating. Feeling irritable or restless. Having trouble sleeping. Feeling depressed. Craving a cigarette. These symptoms may surprise you, but they are normal to have when quitting smoking. To manage withdrawal symptoms: Avoid places, people, and activities that trigger your cravings. Remember why you want to quit. Get plenty of sleep. Avoid coffee and other drinks that contain caffeine. These may worsen some of your  symptoms. How to manage cravings Come up with a plan for how to deal with your cravings. The plan should include the following: A definition of the specific situation you want to deal with. An activity or action you will take to replace smoking. A clear idea for how this action will help. The name of someone who could help you with this. Cravings usually last for 5-10 minutes. Consider taking the following actions to help you with your plan to deal with cravings: Keep your mouth busy. Chew sugar-free gum. Suck on hard candies or a straw. Brush your teeth. Keep your hands and body busy. Change to a different activity right away. Squeeze or play with a ball. Do an activity or a hobby, such as making bead jewelry, practicing needlepoint, or working with wood. Mix up your normal routine. Take a short exercise break. Go for a quick walk, or run up and down stairs. Focus on doing something kind or helpful for someone else. Call a friend or family member to talk during a craving. Join a support group. Contact a quitline. Where to find support To get help or find a support group: Call the National Cancer Institute's Smoking Quitline: 1-800-QUIT-NOW 404-523-3956) Text QUIT to SmokefreeTXT: 454098 Where to find more information Visit these websites to find  more information on quitting smoking: U.S. Department of Health and Human Services: www.smokefree.gov American Lung Association: www.freedomfromsmoking.org Centers for Disease Control and Prevention (CDC): FootballExhibition.com.br American Heart Association: www.heart.org Contact a health care provider if: You want to change your plan for quitting. The medicines you are taking are not helping. Your eating feels out of control or you cannot sleep. You feel depressed or become very anxious. Summary Quitting smoking is a physical and mental challenge. You will face cravings, withdrawal symptoms, and temptation to smoke again. Preparation can help you as  you go through these challenges. Try different techniques to manage stress, handle social situations, and prevent weight gain. You can deal with cravings by keeping your mouth busy (such as by chewing gum), keeping your hands and body busy, calling family or friends, or contacting a quitline for people who want to quit smoking. You can deal with withdrawal symptoms by avoiding places where people smoke, getting plenty of rest, and avoiding drinks that contain caffeine. This information is not intended to replace advice given to you by your health care provider. Make sure you discuss any questions you have with your health care provider. Document Revised: 10/05/2021 Document Reviewed: 10/05/2021 Elsevier Patient Education  2024 [employer].

## 2024-01-13 NOTE — Progress Notes (Signed)
 Cardiology Office Note   Date:  01/13/2024   ID:  Cindy Reid, DOB 10-Mar-1951, MRN [id number]  PCP:  Corwin Levins, MD  Cardiologist: Dr. Duke Salvia  No chief complaint on file.      History of Present Illness: Cindy Reid is a 73 y.o. female who is here today for a follow-up visit regarding peripheral arterial disease.   She has known history of essential hypertension, hyperlipidemia, mild carotid disease, extensive tobacco use and palpitations.  Vascular Doppler studies done in November 2021 showed an ABI of 1.05 on the right and 0.81 on the left.  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  No significant infrainguinal disease on the left side.  There was moderate right iliac disease and some SFA disease.  Over the last few months, she experienced increased left leg pain described as aching sensation.  She has 2 different types of discomfort.  She feels left groin pain with certain movements of her leg.  In addition, she has aching sensation that starts in the buttock area and goes down to the calf.  This happens with walking.  The pain is not severe enough to require her to stop. She is going on a cruise with her family at the end of June that starts in [place].    Past Medical History:  Diagnosis Date   Carotid stenosis 07/28/2017   1-39% bilaterally 05/2015.   Claudication in peripheral vascular disease (HCC) 07/13/2020   Epilepsy (HCC)    Hyperlipidemia    Hypertension    Migraine     Past Surgical History:  Procedure Laterality Date   COLONOSCOPY     MOUTH SURGERY     TUBAL LIGATION       Current Outpatient Medications  Medication Sig Dispense Refill   aspirin EC 81 MG tablet Take 1 tablet (81 mg total) by mouth daily. 90 tablet 11   atorvastatin (LIPITOR) 80 MG tablet TAKE 1 TABLET DAILY 90 tablet 3   Calcium Carb-Cholecalciferol (CALCIUM 1000 + D PO) Take by mouth.     lisinopril (ZESTRIL) 40 MG tablet TAKE 1 TABLET DAILY 90  tablet 3   hydrochlorothiazide (HYDRODIURIL) 12.5 MG tablet Take 1 tablet (12.5 mg total) by mouth daily. 90 tablet 3   No current facility-administered medications for this visit.    Allergies:   Amlodipine and Dilantin [phenytoin]    Social History:  The patient  reports that she has been smoking cigarettes. She has never used smokeless tobacco. She reports current alcohol use. She reports that she does not use drugs.   Family History:  [name] family history includes Cancer in her father and mother; Colon cancer in her maternal aunt; Diabetes in her mother; Heart disease in her father.    ROS:  Please see the history of present illness.   Otherwise, review of systems are positive for none.   All other systems are reviewed and negative.    PHYSICAL EXAM: VS:  BP 132/82 (BP Location: Left Arm, Patient Position: Sitting, Cuff Size: Normal)   Pulse 62   Ht 5\' 2"  (1.575 m)   Wt 147 lb 12.8 oz (67 kg)   SpO2 99%   BMI 27.03 kg/m  , BMI Body mass index is 27.03 kg/m. GEN: Well nourished, well developed, in no acute distress  HEENT: normal  Neck: no JVD, carotid bruits, or masses Cardiac: RRR; no murmurs, rubs, or gallops,no edema  Respiratory:  clear to auscultation bilaterally, normal  work of breathing GI: soft, nontender, nondistended, + BS MS: no deformity or atrophy  Skin: warm and dry, no rash Neuro:  Strength and sensation are intact Psych: euthymic mood, full affect    EKG:  EKG is not ordered today.    Recent Labs: 01/13/2023: ALT 15; Hemoglobin 15.0; Platelets 295.0; TSH 1.18 05/09/2023: BUN 15; Creatinine, Ser 1.36; Potassium 4.3; Sodium 136    Lipid Panel    Component Value Date/Time   CHOL 151 01/13/2023 0850   CHOL 143 08/30/2020 1457   TRIG 150.0 (H) 01/13/2023 0850   HDL 52.90 01/13/2023 0850   HDL 55 08/30/2020 1457   CHOLHDL 3 01/13/2023 0850   VLDL 30.0 01/13/2023 0850   LDLCALC 68 01/13/2023 0850   LDLCALC 65 08/30/2020 1457   LDLDIRECT  65.0 01/03/2022 0856      Wt Readings from Last 3 Encounters:  01/13/24 147 lb 12.8 oz (67 kg)  04/25/23 147 lb 8 oz (66.9 kg)  01/15/23 143 lb (64.9 kg)           No data to display            ASSESSMENT AND PLAN:  1.  Peripheral arterial disease: The patient has known history of occluded left common iliac artery.  She is now describing what seems to be left leg claudication.  In addition, she has a different kind of pain in the groin area that seems to be suggestive of arthritis.   I requested a follow-up ABI to see if there is any change from 2021 and will consider angiography based on the results.  2.  Tobacco use: I again discussed with her the importance of smoking cessation.  I for instructions to help with smoking cessation.  3.  Hyperlipidemia: Continue treatment with atorvastatin.  I reviewed most recent lipid profile from last year which showed an LDL of 68.  She is scheduled for a physical in the near future.  4.  Essential hypertension: Blood pressure is well controlled on current medication.    Disposition:   FU with me in 6 months or earlier if ABI significantly different from before.  Signed,  Lorine Bears, MD  01/13/2024 8:05 AM    [facility]

## 2024-01-16 ENCOUNTER — Ambulatory Visit: Payer: Commercial Managed Care - PPO | Admitting: Internal Medicine

## 2024-01-16 ENCOUNTER — Encounter: Payer: Self-pay | Admitting: Internal Medicine

## 2024-01-16 VITALS — BP 120/78 | HR 103 | Temp 98.3°F | Ht 62.0 in | Wt 148.0 lb

## 2024-01-16 DIAGNOSIS — Z0001 Encounter for general adult medical examination with abnormal findings: Secondary | ICD-10-CM

## 2024-01-16 DIAGNOSIS — Z0000 Encounter for general adult medical examination without abnormal findings: Secondary | ICD-10-CM

## 2024-01-16 DIAGNOSIS — F17200 Nicotine dependence, unspecified, uncomplicated: Secondary | ICD-10-CM

## 2024-01-16 DIAGNOSIS — E7800 Pure hypercholesterolemia, unspecified: Secondary | ICD-10-CM

## 2024-01-16 DIAGNOSIS — R739 Hyperglycemia, unspecified: Secondary | ICD-10-CM

## 2024-01-16 DIAGNOSIS — E538 Deficiency of other specified B group vitamins: Secondary | ICD-10-CM

## 2024-01-16 DIAGNOSIS — N1832 Chronic kidney disease, stage 3b: Secondary | ICD-10-CM | POA: Diagnosis not present

## 2024-01-16 DIAGNOSIS — I10 Essential (primary) hypertension: Secondary | ICD-10-CM | POA: Diagnosis not present

## 2024-01-16 NOTE — Patient Instructions (Addendum)
 Please continue all other medications as before, and refills have been done if requested.  Please have the pharmacy call with any other refills you may need.  Please continue your efforts at being more active, low cholesterol diet, and weight control.  You are otherwise up to date with prevention measures today.  Please keep your appointments with your specialists as you may have planned  You will be contacted regarding the referral for: Renal  Please make an Appointment to return in 6 months, or sooner if needed, also with Lab Appointment for testing done 3-5 days before at the FIRST FLOOR Lab (so this is for TWO appointments - please see the scheduling desk as you leave)

## 2024-01-16 NOTE — Assessment & Plan Note (Signed)
 BP Readings from Last 3 Encounters:  01/16/24 120/78  01/13/24 132/82  05/26/23 129/79   Stable, pt to continue medical treatment lisinopril 40 every day, hct 12.5 qd

## 2024-01-16 NOTE — Assessment & Plan Note (Signed)
 Lab Results  Component Value Date   LDLCALC 53 01/13/2024   Stable, pt to continue current statin lipitor 80 qd

## 2024-01-16 NOTE — Assessment & Plan Note (Signed)
 Lab Results  Component Value Date   CREATININE 1.43 (H) 01/13/2024   Stable overall, cont to avoid nephrotoxins, refer back to Renal though pt states she has not made f/u appt due to billing dispute

## 2024-01-16 NOTE — Assessment & Plan Note (Signed)
 Lab Results  Component Value Date   HGBA1C 6.2 01/13/2024   Stable, pt to continue current medical treatment  - diet, wt control

## 2024-01-16 NOTE — Assessment & Plan Note (Signed)
 Lab Results  Component Value Date   VITAMINB12 556 01/13/2024   Stable, cont oral replacement - b12 1000 mcg qd

## 2024-01-16 NOTE — Assessment & Plan Note (Signed)
 Pt counsled to quit, pt not ready

## 2024-01-16 NOTE — Progress Notes (Signed)
 Patient ID: [name], female   DOB: [birth year], 73 y.o.   MRN: [id number]         Chief Complaint:: wellness exam and smoker, hld, hyperglycemia, htn, ckd3b, low b12       HPI:  Cindy Reid is a 73 y.o. female here for wellness exam; decliens pneumovax and shingrix, o/w up to date                        Also saw Dr Kirke Corin, for f/u arterial vascular study soon for LLE pain.  Pt denies chest pain, increased sob or doe, wheezing, orthopnea, PND, increased LE swelling, palpitations, dizziness or syncope.   Pt denies polydipsia, polyuria, or new focal neuro s/s.    Pt denies fever, wt loss, night sweats, loss of appetite, or other constitutional symptoms  Pt still smoking, not ready to quit   Wt Readings from Last 3 Encounters:  01/16/24 148 lb (67.1 kg)  01/13/24 147 lb 12.8 oz (67 kg)  04/25/23 147 lb 8 oz (66.9 kg)   BP Readings from Last 3 Encounters:  01/16/24 120/78  01/13/24 132/82  05/26/23 129/79   Immunization History  Administered Date(s) Administered   Influenza,inj,Quad PF,6+ Mos 12/07/2015   PFIZER(Purple Top)SARS-COV-2 Vaccination 12/06/2019, 12/31/2019   Tdap 12/07/2015   Health Maintenance Due  Topic Date Due   Pneumonia Vaccine 70+ Years old (1 of 2 - PCV) Never done   Zoster Vaccines- Shingrix (1 of 2) Never done      Past Medical History:  Diagnosis Date   Carotid stenosis 07/28/2017   1-39% bilaterally 05/2015.   Claudication in peripheral vascular disease (HCC) 07/13/2020   Epilepsy (HCC)    Hyperlipidemia    Hypertension    Migraine    Past Surgical History:  Procedure Laterality Date   COLONOSCOPY     MOUTH SURGERY     TUBAL LIGATION      reports that she has been smoking cigarettes. She has never used smokeless tobacco. She reports current alcohol use. She reports that she does not use drugs. family history includes Cancer in her father and mother; Colon cancer in her maternal aunt; Diabetes in her mother; Heart disease in her  father. Allergies  Allergen Reactions   Amlodipine     gingival hyperplasia   Dilantin [Phenytoin] Other (See Comments)    UNSURE OF REACTION   Current Outpatient Medications on File Prior to Visit  Medication Sig Dispense Refill   aspirin EC 81 MG tablet Take 1 tablet (81 mg total) by mouth daily. 90 tablet 11   atorvastatin (LIPITOR) 80 MG tablet TAKE 1 TABLET DAILY 90 tablet 3   Calcium Carb-Cholecalciferol (CALCIUM 1000 + D PO) Take by mouth.     lisinopril (ZESTRIL) 40 MG tablet TAKE 1 TABLET DAILY 90 tablet 3   hydrochlorothiazide (HYDRODIURIL) 12.5 MG tablet Take 1 tablet (12.5 mg total) by mouth daily. 90 tablet 3   No current facility-administered medications on file prior to visit.        ROS:  All others reviewed and negative.  Objective        PE:  BP 120/78 (BP Location: Right Arm, Patient Position: Sitting, Cuff Size: Normal)   Pulse (!) 103   Temp 98.3 F (36.8 C) (Oral)   Ht 5\' 2"  (1.575 m)   Wt 148 lb (67.1 kg)   SpO2 90%   BMI 27.07 kg/m  Constitutional: Pt appears in NAD               HENT: Head: NCAT.                Right Ear: External ear normal.                 Left Ear: External ear normal.                Eyes: . Pupils are equal, round, and reactive to light. Conjunctivae and EOM are normal               Nose: without d/c or deformity               Neck: Neck supple. Gross normal ROM               Cardiovascular: Normal rate and regular rhythm.                 Pulmonary/Chest: Effort normal and breath sounds without rales or wheezing.                Abd:  Soft, NT, ND, + BS, no organomegaly               Neurological: Pt is alert. At baseline orientation, motor grossly intact               Skin: Skin is warm. No rashes, no other new lesions, LE edema - none               Psychiatric: Pt behavior is normal without agitation   Micro: none  Cardiac tracings I have personally interpreted today:  none  Pertinent Radiological findings  (summarize): none   Lab Results  Component Value Date   WBC 9.6 01/13/2024   HGB 14.9 01/13/2024   HCT 43.8 01/13/2024   PLT 275.0 01/13/2024   GLUCOSE 91 01/13/2024   CHOL 130 01/13/2024   TRIG 117.0 01/13/2024   HDL 53.40 01/13/2024   LDLDIRECT 65.0 01/03/2022   LDLCALC 53 01/13/2024   ALT 12 01/13/2024   AST 18 01/13/2024   NA 133 (L) 01/13/2024   K 3.3 (L) 01/13/2024   CL 93 (L) 01/13/2024   CREATININE 1.43 (H) 01/13/2024   BUN 20 01/13/2024   CO2 28 01/13/2024   TSH 1.03 01/13/2024   HGBA1C 6.2 01/13/2024   MICROALBUR 1.7 01/13/2024   Assessment/Plan:  Cindy Reid is a 73 y.o. White or Caucasian [1] female with  has a past medical history of Carotid stenosis (07/28/2017), Claudication in peripheral vascular disease (HCC) (07/13/2020), Epilepsy (HCC), Hyperlipidemia, Hypertension, and Migraine.  Encounter for well adult exam with abnormal findings Age and sex appropriate education and counseling updated with regular exercise and diet Referrals for preventative services - none needed Immunizations addressed - declines pneumovax and shingrix Smoking counseling  - none needed Evidence for depression or other mood disorder - none significant Most recent labs reviewed. I have personally reviewed and have noted: 1) the patient's medical and social history 2) [name] current medications and supplements 3) [name] height, weight, and BMI have been recorded in the chart   Smoker Pt counsled to quit, pt not ready  Hyperlipidemia Lab Results  Component Value Date   LDLCALC 53 01/13/2024   Stable, pt to continue current statin lipitor 80 qd   Hyperglycemia Lab Results  Component Value Date   HGBA1C 6.2 01/13/2024   Stable, pt to continue current medical treatment  -  diet, wt control   Essential hypertension BP Readings from Last 3 Encounters:  01/16/24 120/78  01/13/24 132/82  05/26/23 129/79   Stable, pt to continue medical treatment  lisinopril 40 every day, hct 12.5 qd   CKD (chronic kidney disease) stage 3, GFR 30-59 ml/min (HCC) Lab Results  Component Value Date   CREATININE 1.43 (H) 01/13/2024   Stable overall, cont to avoid nephrotoxins, refer back to Renal though pt states she has not made f/u appt due to billing dispute   B12 deficiency Lab Results  Component Value Date   VITAMINB12 556 01/13/2024   Stable, cont oral replacement - b12 1000 mcg qd  Followup: Return in about 6 months (around 07/18/2024).  Oliver Barre, MD 01/16/2024 7:47 PM [facility] - [facility]

## 2024-01-16 NOTE — Assessment & Plan Note (Signed)
Age and sex appropriate education and counseling updated with regular exercise and diet Referrals for preventative services - none needed Immunizations addressed - declines pneumovax and shingrix Smoking counseling  - none needed Evidence for depression or other mood disorder - none significant Most recent labs reviewed. I have personally reviewed and have noted: 1) the patient's medical and social history 2) [name] current medications and supplements 3) [name] height, weight, and BMI have been recorded in the chart

## 2024-01-20 DIAGNOSIS — J342 Deviated nasal septum: Secondary | ICD-10-CM | POA: Insufficient documentation

## 2024-01-27 ENCOUNTER — Ambulatory Visit (HOSPITAL_COMMUNITY)
Admission: RE | Admit: 2024-01-27 | Discharge: 2024-01-27 | Disposition: A | Discharge status: Home / Self Care | Source: Ambulatory Visit | Attending: Cardiology | Admitting: Cardiology

## 2024-01-27 DIAGNOSIS — I739 Peripheral vascular disease, unspecified: Secondary | ICD-10-CM | POA: Diagnosis present

## 2024-01-27 LAB — VAS US ABI WITH/WO TBI
Left ABI: 0.82
Right ABI: 1.03

## 2024-02-23 ENCOUNTER — Other Ambulatory Visit: Payer: Self-pay | Admitting: Cardiovascular Disease

## 2024-03-08 ENCOUNTER — Ambulatory Visit: Admitting: Dermatology

## 2024-03-08 ENCOUNTER — Encounter: Payer: Self-pay | Admitting: Dermatology

## 2024-03-08 VITALS — BP 143/91 | HR 101

## 2024-03-08 DIAGNOSIS — R220 Localized swelling, mass and lump, head: Secondary | ICD-10-CM | POA: Diagnosis not present

## 2024-03-08 DIAGNOSIS — D1801 Hemangioma of skin and subcutaneous tissue: Secondary | ICD-10-CM | POA: Diagnosis not present

## 2024-03-08 DIAGNOSIS — D485 Neoplasm of uncertain behavior of skin: Secondary | ICD-10-CM

## 2024-03-08 NOTE — Patient Instructions (Addendum)

## 2024-03-08 NOTE — Progress Notes (Signed)
 New Patient Visit   Subjective  Cindy Reid is a 73 y.o. female who presents for the following: Spot Check  Patient states she has spot check located at the face that she would like to have examined. Patient reports the areas have been there for 8 months. She reports the areas are not bothersome. Patient reports she has not previously been treated for these areas. Patient denies Hx of bx. Patient denies family history of skin cancer(s).  The patient has spots, moles and lesions to be evaluated, some may be new or changing and the patient may have concern these could be cancer.  The following portions of the chart were reviewed this encounter and updated as appropriate: medications, allergies, medical history  Review of Systems:  No other skin or systemic complaints except as noted in HPI or Assessment and Plan.  Objective  Well appearing patient in no apparent distress; mood and affect are within normal limits.  A focused examination was performed of the following areas: Face  Relevant exam findings are noted in the Assessment and Plan.  Right Alar Crease 1.2 cm pink vascular nodule  Assessment & Plan   1. Pink vascular nodule on right superior nasolabial fold - Assessment: 1.2 cm pink vascular nodule on the right superior nasolabial fold, developed approximately 8 months ago following dental implant placement. Lesion has been bleeding, concerning for malignancy. Differential diagnoses include basal cell carcinoma, Merkel cell carcinoma, and angiosarcoma. Patient's history of smoking increases risk for skin and oral cancers. Irritation of gum line underneath the lesion noted, but correlation to nodule uncertain. Previous x-rays by dentist showed no signs of infection, and dentist believes lesion is unrelated to dental work. - Plan:    Perform shave biopsy of the 1.2 cm pink vascular nodule     - Obtain informed consent, explain procedure (numbing with lidocaine, shaving off  lesion, cauterizing base)     - Send specimen for pathological examination    Provide post-biopsy care instructions:     - Rinse with water daily     - Apply Aquaphor or Vaseline and cover with Band-Aid when going out     - Leave uncovered with Aquaphor when at home    Follow up in approximately one week to discuss biopsy results    If biopsy confirms skin cancer:     - Refer to on-site surgeon for further excision and margin assessment     - Consider timing of surgery in relation to patient's upcoming vacation in June    Recommend full body skin check in July or August    Strongly advise smoking cessation due to increased risk of skin and oral cancers  NEOPLASM OF UNCERTAIN BEHAVIOR OF SKIN Right Alar Crease Epidermal / dermal shaving  Lesion diameter (cm):  1.2 Informed consent: discussed and consent obtained   Timeout: patient name, date of birth, surgical site, and procedure verified   Procedure prep:  Patient was prepped and draped in usual sterile fashion Prep type:  Isopropyl alcohol Anesthesia: the lesion was anesthetized in a standard fashion   Anesthetic:  1% lidocaine w/ epinephrine 1-100,000 buffered w/ 8.4% NaHCO3 Instrument used: flexible razor blade   Hemostasis achieved with: pressure, aluminum chloride and electrodesiccation   Outcome: patient tolerated procedure well   Post-procedure details: sterile dressing applied and wound care instructions given   Dressing type: bandage and petrolatum   Specimen 1 - Surgical pathology Differential Diagnosis: R/O BCC vs MCC vs Angio Carcoma vs SCC  Check Margins: No  Return in about 3 months (around 06/08/2024) for TBSE.  I, Jetta Ager, am acting as Neurosurgeon for [employer], DO.  Documentation: I have reviewed the above documentation for accuracy and completeness, and I agree with the above.  Louana Roup, DO

## 2024-03-11 LAB — SURGICAL PATHOLOGY

## 2024-03-15 ENCOUNTER — Ambulatory Visit: Payer: Self-pay | Admitting: Dermatology

## 2024-04-02 ENCOUNTER — Other Ambulatory Visit (HOSPITAL_BASED_OUTPATIENT_CLINIC_OR_DEPARTMENT_OTHER): Payer: Self-pay | Admitting: Cardiovascular Disease

## 2024-06-21 ENCOUNTER — Encounter: Payer: Self-pay | Admitting: Dermatology

## 2024-06-21 ENCOUNTER — Ambulatory Visit: Admitting: Dermatology

## 2024-06-21 VITALS — BP 137/99

## 2024-06-21 DIAGNOSIS — L570 Actinic keratosis: Secondary | ICD-10-CM

## 2024-06-21 DIAGNOSIS — Z1283 Encounter for screening for malignant neoplasm of skin: Secondary | ICD-10-CM

## 2024-06-21 DIAGNOSIS — L219 Seborrheic dermatitis, unspecified: Secondary | ICD-10-CM

## 2024-06-21 DIAGNOSIS — D239 Other benign neoplasm of skin, unspecified: Secondary | ICD-10-CM

## 2024-06-21 DIAGNOSIS — L578 Other skin changes due to chronic exposure to nonionizing radiation: Secondary | ICD-10-CM

## 2024-06-21 DIAGNOSIS — L814 Other melanin hyperpigmentation: Secondary | ICD-10-CM | POA: Diagnosis not present

## 2024-06-21 DIAGNOSIS — L821 Other seborrheic keratosis: Secondary | ICD-10-CM | POA: Diagnosis not present

## 2024-06-21 DIAGNOSIS — W908XXA Exposure to other nonionizing radiation, initial encounter: Secondary | ICD-10-CM | POA: Diagnosis not present

## 2024-06-21 DIAGNOSIS — D229 Melanocytic nevi, unspecified: Secondary | ICD-10-CM

## 2024-06-21 DIAGNOSIS — D1801 Hemangioma of skin and subcutaneous tissue: Secondary | ICD-10-CM

## 2024-06-21 MED ORDER — NYSTATIN-TRIAMCINOLONE 100000-0.1 UNIT/GM-% EX OINT: 1.0000 | TOPICAL_OINTMENT | Freq: Two times a day (BID) | CUTANEOUS | 1 refills | Status: DC

## 2024-06-21 NOTE — Patient Instructions (Addendum)

## 2024-06-21 NOTE — Progress Notes (Addendum)
 Total Body Skin Exam (TBSE) Visit   Subjective  Cindy Reid is a 73 y.o. female who presents for the following: Skin Cancer Screening and Full Body Skin Exam  Patient presents today for follow up visit for TBSE. Patient was last evaluated on 03/08/24 . Patient denies medication changes. Patient reports she does not have spots, moles and lesions of concern to be evaluated. Patient reports throughout her lifetime she has had moderate sun exposure. Currently, patient reports if she has excessive sun exposure, she does not apply sunscreen and/or wears protective coverings. Patient reports she has hx of bx (Benign lesion removed from Right Nasal crease). Patient denies  family history of skin cancers. The patient has spots, moles and lesions to be evaluated, some may be new or changing and the patient has concerns that these could be cancer.  The following portions of the chart were reviewed this encounter and updated as appropriate: medications, allergies, medical history  Review of Systems:  No other skin or systemic complaints except as noted in HPI or Assessment and Plan.  Objective  Well appearing patient in no apparent distress; mood and affect are within normal limits.  A full examination was performed including scalp, head, eyes, ears, nose, lips, neck, chest, axillae, abdomen, back, buttocks, bilateral upper extremities, bilateral lower extremities, hands, feet, fingers, toes, fingernails, and toenails. All findings within normal limits unless otherwise noted below.   Relevant physical exam findings are noted in the Assessment and Plan.  Dorsum of Nose, Left Buccal Cheek, Left Temple, Left Zygomatic Area, Neck - Anterior, Right Temple, Right Zygomatic Area Erythematous thin papules/macules with gritty scale.   Assessment & Plan   LENTIGINES, SEBORRHEIC KERATOSES, HEMANGIOMAS - Benign normal skin lesions - Benign-appearing - Call for any changes  MELANOCYTIC NEVI - Tan-brown  and/or pink-flesh-colored symmetric macules and papules - Benign appearing on exam today - Observation - Call clinic for new or changing moles - Recommend daily use of broad spectrum spf 30+ sunscreen to sun-exposed areas.   MILD ACTINIC DAMAGE - Chronic condition, secondary to cumulative UV/sun exposure - diffuse scaly erythematous macules with underlying dyspigmentation - Recommend daily broad spectrum sunscreen SPF 30+ to sun-exposed areas, reapply every 2 hours as needed.  - Staying in the shade or wearing long sleeves, sun glasses (UVA+UVB protection) and wide brim hats (4-inch brim around the entire circumference of the hat) are also recommended for sun protection.  - Call for new or changing lesions.  DERMATOFIBROMA Exam: Firm pink/brown papulenodule with dimple sign. Treatment Plan: A dermatofibroma is a benign growth possibly related to trauma, such as an insect bite, cut from shaving, or inflamed acne-type bump.  Treatment options to remove include shave or excision with resulting scar and risk of recurrence.  Since benign-appearing and not bothersome, will observe for now.   SEBORRHEIC DERMATITIS Exam: Pink patches with greasy scale at face  Flared  Seborrheic Dermatitis is a chronic persistent rash characterized by pinkness and scaling most commonly of the mid face but also can occur on the scalp (dandruff), ears; mid chest, mid back and groin.  It tends to be exacerbated by stress and cooler weather.  People who have neurologic disease may experience new onset or exacerbation of existing seborrheic dermatitis.  The condition is not curable but treatable and can be controlled.  Treatment Plan: - Prescribed Nystatin - TMC to apply for 14 days   SKIN CANCER SCREENING PERFORMED TODAY.  AK (ACTINIC KERATOSIS) (7) Dorsum of Nose, Left Buccal Cheek,  Left Temple, Left Zygomatic Area, Neck - Anterior, Right Temple, Right Zygomatic Area Destruction of lesion - Dorsum of Nose, Left  Buccal Cheek, Left Temple, Left Zygomatic Area, Neck - Anterior, Right Temple, Right Zygomatic Area  Destruction method: cryotherapy   Informed consent: discussed and consent obtained   Timeout:  patient name, date of birth, surgical site, and procedure verified Lesion destroyed using liquid nitrogen: Yes   Cryotherapy cycles:  7 Outcome: patient tolerated procedure well with no complications    SEBORRHEIC DERMATITIS   Related Medications nystatin -triamcinolone  ointment (MYCOLOG) Apply 1 Application topically 2 (two) times daily. Apply 2 times daily for 2 weeks on face then STOP SKIN EXAM FOR MALIGNANT NEOPLASM   MULTIPLE BENIGN MELANOCYTIC NEVI   CHERRY ANGIOMA   LENTIGINES   SEBORRHEIC KERATOSIS   ACTINIC SKIN DAMAGE    Return in about 1 year (around 06/21/2025) for TBSE.  I, Jetta Ager, am acting as Neurosurgeon for [employer], DO.  Documentation: I have reviewed the above documentation for accuracy and completeness, and I agree with the above.  Delon Lenis, DO

## 2024-07-08 ENCOUNTER — Encounter: Payer: Self-pay | Admitting: Cardiovascular Disease

## 2024-07-13 ENCOUNTER — Other Ambulatory Visit (INDEPENDENT_AMBULATORY_CARE_PROVIDER_SITE_OTHER)

## 2024-07-13 DIAGNOSIS — I10 Essential (primary) hypertension: Secondary | ICD-10-CM

## 2024-07-13 DIAGNOSIS — R739 Hyperglycemia, unspecified: Secondary | ICD-10-CM | POA: Diagnosis not present

## 2024-07-13 LAB — BASIC METABOLIC PANEL WITH GFR
BUN: 18 mg/dL (ref 6–23)
CO2: 29 meq/L (ref 19–32)
Calcium: 9.3 mg/dL (ref 8.4–10.5)
Chloride: 96 meq/L (ref 96–112)
Creatinine, Ser: 1.42 mg/dL — ABNORMAL HIGH (ref 0.40–1.20)
GFR: 36.86 mL/min — ABNORMAL LOW (ref >60.00)
Glucose, Bld: 108 mg/dL — ABNORMAL HIGH (ref 70–99)
Potassium: 3.7 meq/L (ref 3.5–5.1)
Sodium: 136 meq/L (ref 135–145)

## 2024-07-13 LAB — LIPID PANEL
Cholesterol: 143 mg/dL (ref 0–200)
HDL: 48.1 mg/dL (ref >39.00)
LDL Cholesterol: 57 mg/dL (ref 0–99)
NonHDL: 94.82
Total CHOL/HDL Ratio: 3
Triglycerides: 189 mg/dL — ABNORMAL HIGH (ref 0.0–149.0)
VLDL: 37.8 mg/dL (ref 0.0–40.0)

## 2024-07-13 LAB — HEMOGLOBIN A1C: Hgb A1c MFr Bld: 6.3 % (ref 4.6–6.5)

## 2024-07-13 LAB — HEPATIC FUNCTION PANEL
ALT: 18 U/L (ref 0–35)
AST: 20 U/L (ref 0–37)
Albumin: 4 g/dL (ref 3.5–5.2)
Alkaline Phosphatase: 50 U/L (ref 39–117)
Bilirubin, Direct: 0.1 mg/dL (ref 0.0–0.3)
Total Bilirubin: 0.5 mg/dL (ref 0.2–1.2)
Total Protein: 7.1 g/dL (ref 6.0–8.3)

## 2024-07-19 ENCOUNTER — Encounter: Payer: Self-pay | Admitting: Internal Medicine

## 2024-07-19 ENCOUNTER — Ambulatory Visit: Admitting: Internal Medicine

## 2024-07-19 VITALS — BP 158/82 | HR 48 | Temp 98.3°F | Ht 62.0 in | Wt 139.8 lb

## 2024-07-19 DIAGNOSIS — R739 Hyperglycemia, unspecified: Secondary | ICD-10-CM | POA: Diagnosis not present

## 2024-07-19 DIAGNOSIS — N1832 Chronic kidney disease, stage 3b: Secondary | ICD-10-CM

## 2024-07-19 DIAGNOSIS — I10 Essential (primary) hypertension: Secondary | ICD-10-CM

## 2024-07-19 DIAGNOSIS — E7800 Pure hypercholesterolemia, unspecified: Secondary | ICD-10-CM

## 2024-07-19 DIAGNOSIS — F17200 Nicotine dependence, unspecified, uncomplicated: Secondary | ICD-10-CM

## 2024-07-19 NOTE — Assessment & Plan Note (Signed)
 BP Readings from Last 3 Encounters:  07/19/24 (!) 158/82  06/21/24 (!) 137/99  03/08/24 (!) 143/91   Uncontrolled here, but pt states likely ok at home, will continue to monitor for no, declines changes today

## 2024-07-19 NOTE — Patient Instructions (Addendum)
 Please have your Shingrix (shingles) shots done at your local pharmacy.  Your sister might be better off with gabapentin  Please continue all other medications as before, and refills have been done if requested.  Please have the pharmacy call with any other refills you may need.  Please continue your efforts at being more active, low cholesterol diet, and weight control.  Please keep your appointments with your specialists as you may have planned  Please make an Appointment to return in 6 months, or sooner if needed, also with Lab Appointment for testing done 3-5 days before at the FIRST FLOOR Lab (so this is for TWO appointments - please see the scheduling desk as you leave)

## 2024-07-19 NOTE — Assessment & Plan Note (Signed)
 Lab Results  Component Value Date   HGBA1C 6.3 07/13/2024   Stable, pt to continue current medical treatment  - diet, wt control

## 2024-07-19 NOTE — Assessment & Plan Note (Signed)
 Lab Results  Component Value Date   LDLCALC 57 07/13/2024   Stable, pt to continue current statin lipitor 80 mg qd

## 2024-07-19 NOTE — Progress Notes (Signed)
 Patient ID: Cindy Reid, female   DOB: 05/27/1951, 73 y.o.   MRN: [id number]        Chief Complaint: follow up HTN, HLD and hyperglycemia, smoker, [id number]       HPI:  Cindy Reid is a 73 y.o. female here overall doing ok,  Pt denies chest pain, increased sob or doe, wheezing, orthopnea, PND, increased LE swelling, palpitations, dizziness or syncope.   Pt denies polydipsia, polyuria, or new focal neuro s/s.    Pt denies fever, wt loss, night sweats, loss of appetite, or other constitutional symptoms  Still smoking, not ready to quit.  Lost wt with less calories, though appetite slightly lower, she doesn't this a problem for now .  Declines pneumonia shot and shingrx Wt Readings from Last 3 Encounters:  07/19/24 139 lb 12.8 oz (63.4 kg)  01/16/24 148 lb (67.1 kg)  01/13/24 147 lb 12.8 oz (67 kg)   BP Readings from Last 3 Encounters:  07/19/24 (!) 158/82  06/21/24 (!) 137/99  03/08/24 (!) 143/91         Past Medical History:  Diagnosis Date   Carotid stenosis 07/28/2017   1-39% bilaterally 05/2015.   Claudication in peripheral vascular disease (HCC) 07/13/2020   Epilepsy (HCC)    Hyperlipidemia    Hypertension    Migraine    Past Surgical History:  Procedure Laterality Date   COLONOSCOPY     MOUTH SURGERY     TUBAL LIGATION      reports that she has been smoking cigarettes. She has never used smokeless tobacco. She reports current alcohol use. She reports that she does not use drugs. family history includes Cancer in her father and mother; Colon cancer in her maternal aunt; Diabetes in her mother; Heart disease in her father. Allergies  Allergen Reactions   Amlodipine      gingival hyperplasia   Dilantin [Phenytoin] Other (See Comments)    UNSURE OF REACTION   Current Outpatient Medications on File Prior to Visit  Medication Sig Dispense Refill   aspirin  EC 81 MG tablet Take 1 tablet (81 mg total) by mouth daily. 90 tablet 11   atorvastatin  (LIPITOR) 80 MG tablet TAKE 1  TABLET DAILY 90 tablet 3   Calcium  Carb-Cholecalciferol (CALCIUM  1000 + D PO) Take by mouth.     hydrochlorothiazide  (HYDRODIURIL ) 12.5 MG tablet TAKE 1 TABLET DAILY 90 tablet 3   lisinopril  (ZESTRIL ) 40 MG tablet TAKE 1 TABLET DAILY 90 tablet 3   nystatin -triamcinolone  ointment (MYCOLOG) Apply 1 Application topically 2 (two) times daily. Apply 2 times daily for 2 weeks on face then STOP 60 g 1   No current facility-administered medications on file prior to visit.        ROS:  All others reviewed and negative.  Objective        PE:  BP (!) 158/82   Pulse (!) 48   Temp 98.3 F (36.8 C)   Ht 5' 2 (1.575 m)   Wt 139 lb 12.8 oz (63.4 kg)   SpO2 94%   BMI 25.57 kg/m                 Constitutional: Pt appears in NAD               HENT: Head: NCAT.                Right Ear: External ear normal.  Left Ear: External ear normal.                Eyes: . Pupils are equal, round, and reactive to light. Conjunctivae and EOM are normal               Nose: without d/c or deformity               Neck: Neck supple. Gross normal ROM               Cardiovascular: Normal rate and regular rhythm.                 Pulmonary/Chest: Effort normal and breath sounds without rales or wheezing.                Abd:  Soft, NT, ND, + BS, no organomegaly               Neurological: Pt is alert. At baseline orientation, motor grossly intact               Skin: Skin is warm. No rashes, no other new lesions, LE edema - none               Psychiatric: Pt behavior is normal without agitation   Micro: none  Cardiac tracings I have personally interpreted today:  none  Pertinent Radiological findings (summarize): none   Lab Results  Component Value Date   WBC 9.6 01/13/2024   HGB 14.9 01/13/2024   HCT 43.8 01/13/2024   PLT 275.0 01/13/2024   GLUCOSE 108 (H) 07/13/2024   CHOL 143 07/13/2024   TRIG 189.0 (H) 07/13/2024   HDL 48.10 07/13/2024   LDLDIRECT 65.0 01/03/2022   LDLCALC 57 07/13/2024    ALT 18 07/13/2024   AST 20 07/13/2024   NA 136 07/13/2024   K 3.7 07/13/2024   CL 96 07/13/2024   CREATININE 1.42 (H) 07/13/2024   BUN 18 07/13/2024   CO2 29 07/13/2024   TSH 1.03 01/13/2024   HGBA1C 6.3 07/13/2024   MICROALBUR 1.7 01/13/2024   Assessment/Plan:  Cindy Reid is a 73 y.o. White or Caucasian [1] female with  has a past medical history of Carotid stenosis (07/28/2017), Claudication in peripheral vascular disease (HCC) (07/13/2020), Epilepsy (HCC), Hyperlipidemia, Hypertension, and Migraine.  Smoker Pt counseled to quit, pt not ready  Hyperlipidemia Lab Results  Component Value Date   LDLCALC 57 07/13/2024   Stable, pt to continue current statin lipitor 80 mg qd   Hyperglycemia Lab Results  Component Value Date   HGBA1C 6.3 07/13/2024   Stable, pt to continue current medical treatment  - diet, wt control   Essential hypertension BP Readings from Last 3 Encounters:  07/19/24 (!) 158/82  06/21/24 (!) 137/99  03/08/24 (!) 143/91   Uncontrolled here, but pt states likely ok at home, will continue to monitor for no, declines changes today   CKD (chronic kidney disease) stage 3, GFR 30-59 ml/min (HCC) Lab Results  Component Value Date   CREATININE 1.42 (H) 07/13/2024   Stable overall, cont to avoid nephrotoxins  Followup: Return in about 6 months (around 01/16/2025).  Lynwood Rush, MD 07/19/2024 12:56 PM [facility]

## 2024-07-19 NOTE — Assessment & Plan Note (Signed)
 Lab Results  Component Value Date   CREATININE 1.42 (H) 07/13/2024   Stable overall, cont to avoid nephrotoxins

## 2024-07-19 NOTE — Assessment & Plan Note (Signed)
 Pt counseled to quit, pt not ready

## 2024-07-20 ENCOUNTER — Ambulatory Visit: Attending: Cardiovascular Disease | Admitting: Cardiovascular Disease

## 2024-07-20 ENCOUNTER — Encounter: Payer: Self-pay | Admitting: Cardiovascular Disease

## 2024-07-20 VITALS — BP 150/80 | HR 92 | Ht 62.0 in | Wt 139.4 lb

## 2024-07-20 DIAGNOSIS — E785 Hyperlipidemia, unspecified: Secondary | ICD-10-CM

## 2024-07-20 DIAGNOSIS — F17200 Nicotine dependence, unspecified, uncomplicated: Secondary | ICD-10-CM | POA: Diagnosis not present

## 2024-07-20 DIAGNOSIS — I10 Essential (primary) hypertension: Secondary | ICD-10-CM | POA: Diagnosis not present

## 2024-07-20 DIAGNOSIS — I739 Peripheral vascular disease, unspecified: Secondary | ICD-10-CM

## 2024-07-20 NOTE — Progress Notes (Signed)
 Cardiology Office Note   Date:  07/20/2024   ID:  Cindy Reid, DOB 1951-02-13, MRN [id number]  PCP:  Norleen Lynwood ORN, MD  Cardiologist: Dr. Raford  No chief complaint on file.      History of Present Illness: Cindy Reid is a 73 y.o. female who is here today for a follow-up visit regarding peripheral arterial disease.   She has known history of essential hypertension, hyperlipidemia, mild carotid disease, extensive tobacco use and palpitations.  Vascular Doppler studies done in November 2021 showed an ABI of 1.05 on the right and 0.81 on the left.  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  No significant infrainguinal disease on the left side.  There was moderate right iliac disease and some SFA disease.  She was seen in March for worsening leg pain but some of the symptoms were felt to be not claudication.  I repeated her Doppler studies which showed stable ABI on the left side at 0.81 and normal on the right side.  She reports improvement in symptoms overall.  No chest pain or worsening dyspnea.  Unfortunately, she continues to smoke.     Past Medical History:  Diagnosis Date   Carotid stenosis 07/28/2017   1-39% bilaterally 05/2015.   Claudication in peripheral vascular disease 07/13/2020   Epilepsy (HCC)    Hyperlipidemia    Hypertension    Migraine     Past Surgical History:  Procedure Laterality Date   COLONOSCOPY     MOUTH SURGERY     TUBAL LIGATION       Current Outpatient Medications  Medication Sig Dispense Refill   aspirin  EC 81 MG tablet Take 1 tablet (81 mg total) by mouth daily. 90 tablet 11   atorvastatin  (LIPITOR) 80 MG tablet TAKE 1 TABLET DAILY 90 tablet 3   hydrochlorothiazide  (HYDRODIURIL ) 12.5 MG tablet TAKE 1 TABLET DAILY 90 tablet 3   lisinopril  (ZESTRIL ) 40 MG tablet TAKE 1 TABLET DAILY 90 tablet 3   No current facility-administered medications for this visit.    Allergies:   Amlodipine  and Dilantin  [phenytoin]    Social History:  The patient  reports that she has been smoking cigarettes. She has never used smokeless tobacco. She reports current alcohol use. She reports that she does not use drugs.   Family History:  [name] family history includes Cancer in her father and mother; Colon cancer in her maternal aunt; Diabetes in her mother; Heart disease in her father.    ROS:  Please see the history of present illness.   Otherwise, review of systems are positive for none.   All other systems are reviewed and negative.    PHYSICAL EXAM: VS:  BP (!) 150/80   Pulse 92   Ht 5' 2 (1.575 m)   Wt 139 lb 6.4 oz (63.2 kg)   SpO2 98%   BMI 25.50 kg/m  , BMI Body mass index is 25.5 kg/m. GEN: Well nourished, well developed, in no acute distress  HEENT: normal  Neck: no JVD, carotid bruits, or masses Cardiac: RRR; no murmurs, rubs, or gallops,no edema  Respiratory:  clear to auscultation bilaterally, normal work of breathing GI: soft, nontender, nondistended, + BS MS: no deformity or atrophy  Skin: warm and dry, no rash Neuro:  Strength and sensation are intact Psych: euthymic mood, full affect    EKG:  EKG is  ordered today. Sinus rhythm with frequent Premature ventricular complexes and Premature atrial complexes When  compared with ECG of 25-Apr-2023 10:21, Premature ventricular complexes are now Present Premature atrial complexes are now Present    Recent Labs: 01/13/2024: Hemoglobin 14.9; Platelets 275.0; TSH 1.03 07/13/2024: ALT 18; BUN 18; Creatinine, Ser 1.42; Potassium 3.7; Sodium 136    Lipid Panel    Component Value Date/Time   CHOL 143 07/13/2024 0846   CHOL 143 08/30/2020 1457   TRIG 189.0 (H) 07/13/2024 0846   HDL 48.10 07/13/2024 0846   HDL 55 08/30/2020 1457   CHOLHDL 3 07/13/2024 0846   VLDL 37.8 07/13/2024 0846   LDLCALC 57 07/13/2024 0846   LDLCALC 65 08/30/2020 1457   LDLDIRECT 65.0 01/03/2022 0856      Wt Readings from Last 3 Encounters:   07/20/24 139 lb 6.4 oz (63.2 kg)  07/19/24 139 lb 12.8 oz (63.4 kg)  01/16/24 148 lb (67.1 kg)           No data to display            ASSESSMENT AND PLAN:  1.  Peripheral arterial disease: The patient has known history of occluded left common iliac artery.  She has stable mild claudication which is currently not lifestyle-limiting.  No indication for revascularization.  2.  Tobacco use: I again discussed with her the importance of smoking cessation.  She reports inability to quit.  3.  Hyperlipidemia: Continue treatment with atorvastatin .  I reviewed most recent lipid profile which showed an LDL of 57.  4.  Essential hypertension: She reports that her blood pressure is always high at the doctor's office and she likely has a component of whitecoat syndrome.  Continue lisinopril  and hydrochlorothiazide  for now.    Disposition:   FU with me in 12 months.  Signed,  Deatrice Cage, MD  07/20/2024 11:46 AM    [facility]

## 2024-07-20 NOTE — Patient Instructions (Signed)
 Medication Instructions:  Your physician recommends that you continue on your current medications as directed. Please refer to the Current Medication list given to you today.  *If you need a refill on your cardiac medications before your next appointment, please call your pharmacy*  Lab Work: NONE  If you have labs (blood work) drawn today and your tests are completely normal, you will receive your results only by: MyChart Message (if you have MyChart) OR A paper copy in the mail If you have any lab test that is abnormal or we need to change your treatment, we will call you to review the results.  Testing/Procedures: NONE  Follow-Up: At [facility], you and your health needs are our priority.  As part of our continuing mission to provide you with exceptional heart care, our providers are all part of one team.  This team includes your primary Cardiologist (physician) and Advanced Practice Providers or APPs (Physician Assistants and Nurse Practitioners) who all work together to provide you with the care you need, when you need it.  Your next appointment:   1 year(s)  Provider:   Deatrice Cage, MD

## 2024-08-31 ENCOUNTER — Other Ambulatory Visit: Payer: Self-pay

## 2024-08-31 ENCOUNTER — Other Ambulatory Visit: Payer: Self-pay | Admitting: Internal Medicine

## 2024-11-09 NOTE — Progress Notes (Signed)
 " [facility] [address] [place] [address] Dept: [phone] Dept Fax: [phone]  New Patient Office Visit  Subjective:    Patient ID: Cindy Reid, female    DOB: [birth year], 74 y.o..   MRN: [id number]  No chief complaint on file.  History of Present Illness:  Patient is in today to establish care.  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   Smoker 01/08/2021   Claudication in peripheral vascular disease 07/13/2020   CKD (chronic kidney disease) stage 3, GFR 30-59 ml/min (HCC) 07/06/2018   Sinusitis chronic, frontal 07/06/2018   Carotid stenosis 07/28/2017   Paresthesias/numbness 12/25/2016   Raynaud phenomenon 12/25/2016   Right groin pain 12/25/2016   Hyperlipidemia 06/03/2015   Migraine    Epilepsy (HCC)    Encounter for well adult exam with abnormal findings 06/01/2015   Essential hypertension 06/01/2015   Syncope 06/01/2015   Palpitations 06/01/2015   Hyperglycemia 06/01/2015   Past Surgical History:  Procedure Laterality Date   COLONOSCOPY     MOUTH SURGERY     TUBAL LIGATION     Family History  Problem Relation Age of Onset   Cancer Mother        Breast   Diabetes Mother    Cancer Father        Lung   Heart disease Father    Colon cancer Maternal Aunt    Outpatient Medications Prior to Visit  Medication Sig Dispense Refill   aspirin  EC 81 MG tablet Take 1 tablet (81 mg total) by mouth daily. 90 tablet 11   atorvastatin  (LIPITOR) 80 MG tablet TAKE 1 TABLET DAILY 90 tablet 3   hydrochlorothiazide  (HYDRODIURIL ) 12.5 MG tablet TAKE 1 TABLET DAILY 90 tablet 3   lisinopril  (ZESTRIL ) 40 MG tablet TAKE 1 TABLET DAILY 90 tablet 3   No facility-administered medications prior to visit.    Allergies[1] Objective:   There were no vitals filed for this visit. There is no height or weight on file to calculate BMI.   General: Well developed, well nourished. No acute distress. HEENT: Normocephalic, non-traumatic. PERRL, EOMI. Conjunctiva clear. External ears normal. EAC and TMs normal bilaterally. Nose    clear without congestion or rhinorrhea. Mucous membranes moist. Oropharynx clear. Good dentition. Neck: Supple. No lymphadenopathy. No thyromegaly. Lungs: Clear to auscultation bilaterally. No wheezing, rales or rhonchi. CV: RRR without murmurs or rubs. Pulses 2+ bilaterally. Abdomen: Soft, non-tender. Bowel sounds positive, normal pitch and frequency. No hepatosplenomegaly. No rebound or guarding. Back: Straight. No CVA tenderness bilaterally. Extremities: Full ROM. No joint swelling or tenderness. No edema noted. Skin: Warm and dry. No rashes. Neuro: CN II-XII intact. Normal sensation and DTR bilaterally. Psych: Alert and oriented. Normal mood and affect.  Health Maintenance Due  Topic Date Due   Pneumococcal Vaccine: 50+ Years (1 of 2 - PCV) Never done   Zoster Vaccines- Shingrix (1 of 2) Never done    Imaging No results found.   Lab Results {Labs (Optional):29002}    Assessment & Plan:   Problem List Items Addressed This Visit   None   No follow-ups on file.   Garnette CHRISTELLA Simpler, MD  I,Emily Lagle,acting as a scribe for Garnette CHRISTELLA Simpler, MD.,have documented all relevant documentation on the behalf  of Garnette CHRISTELLA Simpler, MD.  I, Garnette CHRISTELLA Simpler, MD, have reviewed all documentation for this visit. The documentation on 11/10/2024 for the exam, diagnosis, procedures, and orders are all accurate and complete.    [1]  Allergies Allergen Reactions   Amlodipine      gingival hyperplasia   Dilantin [Phenytoin] Other (See Comments)    UNSURE OF REACTION   "

## 2024-11-10 ENCOUNTER — Encounter: Payer: Self-pay | Admitting: Family Medicine

## 2024-11-10 ENCOUNTER — Ambulatory Visit: Admitting: Family Medicine

## 2024-11-10 VITALS — BP 138/76 | HR 62 | Temp 98.1°F | Ht 62.0 in | Wt 138.8 lb

## 2024-11-10 DIAGNOSIS — Z780 Asymptomatic menopausal state: Secondary | ICD-10-CM | POA: Diagnosis not present

## 2024-11-10 DIAGNOSIS — E7800 Pure hypercholesterolemia, unspecified: Secondary | ICD-10-CM | POA: Diagnosis not present

## 2024-11-10 DIAGNOSIS — N1832 Chronic kidney disease, stage 3b: Secondary | ICD-10-CM | POA: Diagnosis not present

## 2024-11-10 DIAGNOSIS — I10 Essential (primary) hypertension: Secondary | ICD-10-CM

## 2024-11-10 DIAGNOSIS — F17200 Nicotine dependence, unspecified, uncomplicated: Secondary | ICD-10-CM

## 2024-11-10 DIAGNOSIS — Z13820 Encounter for screening for osteoporosis: Secondary | ICD-10-CM

## 2024-11-10 DIAGNOSIS — G40909 Epilepsy, unspecified, not intractable, without status epilepticus: Secondary | ICD-10-CM | POA: Diagnosis not present

## 2024-11-10 DIAGNOSIS — M79602 Pain in left arm: Secondary | ICD-10-CM

## 2024-11-10 DIAGNOSIS — I739 Peripheral vascular disease, unspecified: Secondary | ICD-10-CM | POA: Diagnosis not present

## 2024-11-10 MED ORDER — NAPROXEN 500 MG PO TABS: 500.0000 mg | ORAL_TABLET | Freq: Two times a day (BID) | ORAL | 0 refills | Status: AC

## 2024-11-10 NOTE — Assessment & Plan Note (Signed)
 Advised smoking cessation. Discussed medication assistance with quitting. She remains contemplative.  I spent 5 minutes counseling the patient about tobacco cessation.

## 2024-11-10 NOTE — Assessment & Plan Note (Signed)
 Systolic blood pressure is mildly high today. I recommend she monitor this periodically at home. Continue lisinopril  40 mg daily and HCTZ 12.5 mg daily.

## 2024-11-10 NOTE — Assessment & Plan Note (Signed)
Lipids are at goal. Continue atorvastatin 80 mg daily.

## 2024-11-10 NOTE — Assessment & Plan Note (Signed)
 Follows with vascular surgery. Continue lipid management. Strongly advise tobacco cessation.

## 2024-11-10 NOTE — Assessment & Plan Note (Addendum)
 Focus on blood pressure control, adequate hydration, and avoidance of nephrotoxic medications. Continue ACE-I.

## 2024-12-22 ENCOUNTER — Ambulatory Visit: Admitting: Dermatology

## 2025-01-17 ENCOUNTER — Ambulatory Visit: Admitting: Internal Medicine

## 2025-02-01 ENCOUNTER — Ambulatory Visit (HOSPITAL_BASED_OUTPATIENT_CLINIC_OR_DEPARTMENT_OTHER): Admitting: Cardiovascular Disease

## 2025-02-08 ENCOUNTER — Ambulatory Visit: Admitting: Family Medicine

## 2025-06-21 ENCOUNTER — Ambulatory Visit: Admitting: Dermatology
